# Patient Record
Sex: Female | Born: 1970 | Race: Black or African American | Hispanic: No | Marital: Single | Smoking: Current every day smoker
Health system: Southern US, Community
[De-identification: ages and names within clinical notes are randomized; demographics above are authoritative.]

## PROBLEM LIST (undated history)

## (undated) DIAGNOSIS — D649 Anemia, unspecified: Secondary | ICD-10-CM

## (undated) DIAGNOSIS — K219 Gastro-esophageal reflux disease without esophagitis: Secondary | ICD-10-CM

## (undated) DIAGNOSIS — C801 Malignant (primary) neoplasm, unspecified: Secondary | ICD-10-CM

## (undated) DIAGNOSIS — R7303 Prediabetes: Secondary | ICD-10-CM

## (undated) DIAGNOSIS — I10 Essential (primary) hypertension: Secondary | ICD-10-CM

## (undated) DIAGNOSIS — H409 Unspecified glaucoma: Secondary | ICD-10-CM

## (undated) HISTORY — PX: COLONOSCOPY W/ POLYPECTOMY: SHX1380

## (undated) HISTORY — PX: VULVA SURGERY: SHX837

---

## 2004-11-15 ENCOUNTER — Emergency Department (HOSPITAL_COMMUNITY): Admission: EM | Admit: 2004-11-15 | Discharge: 2004-11-15 | Payer: Self-pay | Admitting: Emergency Medicine

## 2006-06-06 ENCOUNTER — Emergency Department (HOSPITAL_COMMUNITY): Admission: EM | Admit: 2006-06-06 | Discharge: 2006-06-07 | Payer: Self-pay | Admitting: Emergency Medicine

## 2006-06-14 ENCOUNTER — Encounter (HOSPITAL_COMMUNITY): Admission: RE | Admit: 2006-06-14 | Discharge: 2006-07-11 | Payer: Self-pay | Admitting: Family Medicine

## 2014-09-21 ENCOUNTER — Emergency Department (HOSPITAL_COMMUNITY)
Admission: EM | Admit: 2014-09-21 | Discharge: 2014-09-21 | Disposition: A | Discharge status: Home / Self Care | Payer: BLUE CROSS/BLUE SHIELD | Attending: Emergency Medicine | Admitting: Emergency Medicine

## 2014-09-21 ENCOUNTER — Encounter (HOSPITAL_COMMUNITY): Payer: Self-pay | Admitting: Emergency Medicine

## 2014-09-21 ENCOUNTER — Emergency Department (HOSPITAL_COMMUNITY): Payer: BLUE CROSS/BLUE SHIELD

## 2014-09-21 DIAGNOSIS — Y9389 Activity, other specified: Secondary | ICD-10-CM | POA: Diagnosis not present

## 2014-09-21 DIAGNOSIS — Y9289 Other specified places as the place of occurrence of the external cause: Secondary | ICD-10-CM | POA: Insufficient documentation

## 2014-09-21 DIAGNOSIS — Z8669 Personal history of other diseases of the nervous system and sense organs: Secondary | ICD-10-CM | POA: Insufficient documentation

## 2014-09-21 DIAGNOSIS — Z720 Tobacco use: Secondary | ICD-10-CM | POA: Insufficient documentation

## 2014-09-21 DIAGNOSIS — S62610A Displaced fracture of proximal phalanx of right index finger, initial encounter for closed fracture: Secondary | ICD-10-CM | POA: Diagnosis not present

## 2014-09-21 DIAGNOSIS — X58XXXA Exposure to other specified factors, initial encounter: Secondary | ICD-10-CM | POA: Diagnosis not present

## 2014-09-21 DIAGNOSIS — I10 Essential (primary) hypertension: Secondary | ICD-10-CM | POA: Diagnosis not present

## 2014-09-21 DIAGNOSIS — S62619A Displaced fracture of proximal phalanx of unspecified finger, initial encounter for closed fracture: Secondary | ICD-10-CM

## 2014-09-21 DIAGNOSIS — S6991XA Unspecified injury of right wrist, hand and finger(s), initial encounter: Secondary | ICD-10-CM | POA: Diagnosis present

## 2014-09-21 DIAGNOSIS — Y998 Other external cause status: Secondary | ICD-10-CM | POA: Diagnosis not present

## 2014-09-21 HISTORY — DX: Essential (primary) hypertension: I10

## 2014-09-21 HISTORY — DX: Unspecified glaucoma: H40.9

## 2014-09-21 MED ORDER — KETOROLAC TROMETHAMINE 60 MG/2ML IM SOLN
60.0000 mg | Freq: Once | INTRAMUSCULAR | Status: AC
  Administered 2014-09-21: 60 mg via INTRAMUSCULAR
  Filled 2014-09-21: qty 2

## 2014-09-21 MED ORDER — NAPROXEN 500 MG PO TABS: 500.0000 mg | ORAL_TABLET | Freq: Two times a day (BID) | ORAL | Status: DC

## 2014-09-21 NOTE — Discharge Instructions (Signed)
Cast or Splint Care Casts and splints support injured limbs and keep bones from moving while they heal.  HOME CARE  Keep the cast or splint uncovered during the drying period.  A plaster cast can take 24 to 48 hours to dry.  A fiberglass cast will dry in less than 1 hour.  Do not rest the cast on anything harder than a pillow for 24 hours.  Do not put weight on your injured limb. Do not put pressure on the cast. Wait for your doctor's approval.  Keep the cast or splint dry.  Cover the cast or splint with a plastic bag during baths or wet weather.  If you have a cast over your chest and belly (trunk), take sponge baths until the cast is taken off.  If your cast gets wet, dry it with a towel or blow dryer. Use the cool setting on the blow dryer.  Keep your cast or splint clean. Wash a dirty cast with a damp cloth.  Do not put any objects under your cast or splint.  Do not scratch the skin under the cast with an object. If itching is a problem, use a blow dryer on a cool setting over the itchy area.  Do not trim or cut your cast.  Do not take out the padding from inside your cast.  Exercise your joints near the cast as told by your doctor.  Raise (elevate) your injured limb on 1 or 2 pillows for the first 1 to 3 days. GET HELP IF:  Your cast or splint cracks.  Your cast or splint is too tight or too loose.  You itch badly under the cast.  Your cast gets wet or has a soft spot.  You have a bad smell coming from the cast.  You get an object stuck under the cast.  Your skin around the cast becomes red or sore.  You have new or more pain after the cast is put on. GET HELP RIGHT AWAY IF:  You have fluid leaking through the cast.  You cannot move your fingers or toes.  Your fingers or toes turn blue or white or are cool, painful, or puffy (swollen).  You have tingling or lose feeling (numbness) around the injured area.  You have bad pain or pressure under the  cast.  You have trouble breathing or have shortness of breath.  You have chest pain. Document Released: 10/28/2010 Document Revised: 02/28/2013 Document Reviewed: 01/04/2013 [facility] Patient Information 2015 [place], [place]. This information is not intended to replace advice given to you by your health care provider. Make sure you discuss any questions you have with your health care provider.

## 2014-09-21 NOTE — ED Notes (Signed)
Patient c/o right index finger swelling and pain. Per patient started swelling yesterday with pain. Per patient worse today, patient unable to bend finger. Denies any known injury. Per patient did the same thing a month ago in which she applied ice several times and soreness stayed for "several days" but eventually went away.

## 2014-09-23 NOTE — ED Provider Notes (Signed)
CSN: [id number]     Arrival date & time 09/21/14  0756 History   First MD Initiated Contact with Patient 09/21/14 0825     Chief Complaint  Patient presents with  . Hand Pain     (Consider location/radiation/quality/duration/timing/severity/associated sxs/prior Treatment) HPI  Anna Case is a 44 y.o. female who presents to the Emergency Department complaining of right index finger pain and swelling for 1-2 days.  She states the pain is a throbbing sensation and worse with attempted movement of the finger.  She states that she performs repetitive movements and uses shears at her job.  She denies known injury, numbness, redness of the finger or pain proximal to the finger.  She has not taken any medications for her symptoms.  Past Medical History  Diagnosis Date  . Hypertension   . Glaucoma    History reviewed. No pertinent past surgical history. Family History  Problem Relation Age of Onset  . Diabetes Mother   . Heart attack Father    History  Substance Use Topics  . Smoking status: Current Every Day Smoker -- 0.07 packs/day for 20 years    Types: Cigarettes  . Smokeless tobacco: Never Used  . Alcohol Use: No   OB History    Gravida Para Term Preterm AB TAB SAB Ectopic Multiple Living            0     Review of Systems  Constitutional: Negative for fever and chills.  Genitourinary: Negative for dysuria and difficulty urinating.  Musculoskeletal: Positive for joint swelling and arthralgias.  Skin: Negative for color change and wound.  All other systems reviewed and are negative.     Allergies  Review of patient's allergies indicates no known allergies.  Home Medications   Prior to Admission medications   Medication Sig Start Date End Date Taking? Authorizing Provider  naproxen (NAPROSYN) 500 MG tablet Take 1 tablet (500 mg total) by mouth 2 (two) times daily with a meal. 09/21/14   Tammi Shandell Giovanni, PA-C   BP 187/106 mmHg  Pulse 87  Temp(Src) 98.2 F  (36.8 C) (Oral)  Resp 18  SpO2 100%  LMP 09/07/2014 Physical Exam  Constitutional: She is oriented to person, place, and time. She appears well-developed and well-nourished. No distress.  HENT:  Head: Normocephalic and atraumatic.  Cardiovascular: Normal rate, regular rhythm, normal heart sounds and intact distal pulses.   Pulmonary/Chest: Effort normal and breath sounds normal. No respiratory distress.  Musculoskeletal: She exhibits edema and tenderness.  Localized tenderness and STS of the PIP joint of the right index finger.  Radial pulse is brisk, distal sensation intact.  CR< 2 sec.  No bruising. erythema or bony deformity.  Patient has full ROM.  Neurological: She is alert and oriented to person, place, and time. She exhibits normal muscle tone. Coordination normal.  Skin: Skin is warm and dry. No rash noted. No erythema.  Nursing note and vitals reviewed.   ED Course  Procedures (including critical care time) Labs Review Labs Reviewed - No data to display  Imaging Review Dg Finger Index Right  09/21/2014   CLINICAL DATA:  Proximal PIP joint pain and swelling. Inability to straighten finger completely.  EXAM: RIGHT INDEX FINGER 2+V  COMPARISON:  None.  FINDINGS: There is a tiny (approximately 2 mm) osseous fragment about the ulnar distal aspect of the proximal phalanx of the second digit, likely the sequela of age-indeterminate avulsive injury. This finding is associated with apparent adjacent soft tissue swelling. No  dislocation. Joint spaces appear grossly preserved given obliquity. No radiopaque foreign body.  IMPRESSION: Age-indeterminate tiny (approximately 2 mm) osseous fragment about the distal ulnar aspect of the proximal phalanx of the second digit with associated adjacent soft tissue swelling. Correlation for point tenderness at this location is recommended.   Electronically Signed   By: Sandi Mariscal M.D.   On: 09/21/2014 09:10      EKG Interpretation None      MDM    Final diagnoses:  Avulsion fracture of proximal phalanx of finger, closed, initial encounter    Pt with tenderness and edema of the right index finger.  NV intact.  Possible avulsion fx.  Finger splinted, pain improved.  She agrees to close orthopedic f/u.  rx for naprosyn    Patrice Paradise, PA-C 09/23/14 2053  Nat Christen, MD 09/26/14 1534

## 2017-05-03 ENCOUNTER — Encounter: Payer: Self-pay | Admitting: *Deleted

## 2017-05-06 ENCOUNTER — Encounter: Payer: Self-pay | Admitting: Obstetrics & Gynecology

## 2020-08-04 DIAGNOSIS — H2513 Age-related nuclear cataract, bilateral: Secondary | ICD-10-CM | POA: Diagnosis not present

## 2020-08-04 DIAGNOSIS — H401133 Primary open-angle glaucoma, bilateral, severe stage: Secondary | ICD-10-CM | POA: Diagnosis not present

## 2020-08-13 DIAGNOSIS — Z0100 Encounter for examination of eyes and vision without abnormal findings: Secondary | ICD-10-CM | POA: Diagnosis not present

## 2020-08-25 DIAGNOSIS — H401133 Primary open-angle glaucoma, bilateral, severe stage: Secondary | ICD-10-CM | POA: Diagnosis not present

## 2020-08-25 DIAGNOSIS — H2513 Age-related nuclear cataract, bilateral: Secondary | ICD-10-CM | POA: Diagnosis not present

## 2020-11-12 DIAGNOSIS — H2513 Age-related nuclear cataract, bilateral: Secondary | ICD-10-CM | POA: Diagnosis not present

## 2020-11-12 DIAGNOSIS — H401133 Primary open-angle glaucoma, bilateral, severe stage: Secondary | ICD-10-CM | POA: Diagnosis not present

## 2020-12-10 DIAGNOSIS — Z299 Encounter for prophylactic measures, unspecified: Secondary | ICD-10-CM | POA: Diagnosis not present

## 2020-12-10 DIAGNOSIS — I10 Essential (primary) hypertension: Secondary | ICD-10-CM | POA: Diagnosis not present

## 2020-12-10 DIAGNOSIS — H409 Unspecified glaucoma: Secondary | ICD-10-CM | POA: Diagnosis not present

## 2020-12-10 DIAGNOSIS — F17210 Nicotine dependence, cigarettes, uncomplicated: Secondary | ICD-10-CM | POA: Diagnosis not present

## 2020-12-10 DIAGNOSIS — Z6836 Body mass index (BMI) 36.0-36.9, adult: Secondary | ICD-10-CM | POA: Diagnosis not present

## 2020-12-30 DIAGNOSIS — Z79899 Other long term (current) drug therapy: Secondary | ICD-10-CM | POA: Diagnosis not present

## 2020-12-30 DIAGNOSIS — I10 Essential (primary) hypertension: Secondary | ICD-10-CM | POA: Diagnosis not present

## 2020-12-30 DIAGNOSIS — Z1339 Encounter for screening examination for other mental health and behavioral disorders: Secondary | ICD-10-CM | POA: Diagnosis not present

## 2020-12-30 DIAGNOSIS — Z1231 Encounter for screening mammogram for malignant neoplasm of breast: Secondary | ICD-10-CM | POA: Diagnosis not present

## 2020-12-30 DIAGNOSIS — Z6837 Body mass index (BMI) 37.0-37.9, adult: Secondary | ICD-10-CM | POA: Diagnosis not present

## 2020-12-30 DIAGNOSIS — Z1211 Encounter for screening for malignant neoplasm of colon: Secondary | ICD-10-CM | POA: Diagnosis not present

## 2020-12-30 DIAGNOSIS — R87610 Atypical squamous cells of undetermined significance on cytologic smear of cervix (ASC-US): Secondary | ICD-10-CM | POA: Diagnosis not present

## 2020-12-30 DIAGNOSIS — Z0000 Encounter for general adult medical examination without abnormal findings: Secondary | ICD-10-CM | POA: Diagnosis not present

## 2020-12-30 DIAGNOSIS — R875 Abnormal microbiological findings in specimens from female genital organs: Secondary | ICD-10-CM | POA: Diagnosis not present

## 2020-12-30 DIAGNOSIS — Z01419 Encounter for gynecological examination (general) (routine) without abnormal findings: Secondary | ICD-10-CM | POA: Diagnosis not present

## 2020-12-30 DIAGNOSIS — Z7189 Other specified counseling: Secondary | ICD-10-CM | POA: Diagnosis not present

## 2021-01-08 ENCOUNTER — Other Ambulatory Visit: Payer: Self-pay | Admitting: Internal Medicine

## 2021-01-08 DIAGNOSIS — Z139 Encounter for screening, unspecified: Secondary | ICD-10-CM

## 2021-01-09 ENCOUNTER — Ambulatory Visit
Admission: RE | Admit: 2021-01-09 | Discharge: 2021-01-09 | Disposition: A | Discharge status: Home / Self Care | Payer: Medicare HMO | Source: Ambulatory Visit | Attending: Internal Medicine | Admitting: Internal Medicine

## 2021-01-09 ENCOUNTER — Other Ambulatory Visit: Payer: Self-pay

## 2021-01-09 DIAGNOSIS — Z1231 Encounter for screening mammogram for malignant neoplasm of breast: Secondary | ICD-10-CM | POA: Diagnosis not present

## 2021-01-09 DIAGNOSIS — Z139 Encounter for screening, unspecified: Secondary | ICD-10-CM

## 2021-01-23 DIAGNOSIS — E2839 Other primary ovarian failure: Secondary | ICD-10-CM | POA: Diagnosis not present

## 2021-02-02 DIAGNOSIS — F17210 Nicotine dependence, cigarettes, uncomplicated: Secondary | ICD-10-CM | POA: Diagnosis not present

## 2021-02-02 DIAGNOSIS — Z299 Encounter for prophylactic measures, unspecified: Secondary | ICD-10-CM | POA: Diagnosis not present

## 2021-02-02 DIAGNOSIS — I10 Essential (primary) hypertension: Secondary | ICD-10-CM | POA: Diagnosis not present

## 2021-03-13 DIAGNOSIS — H04123 Dry eye syndrome of bilateral lacrimal glands: Secondary | ICD-10-CM | POA: Diagnosis not present

## 2021-03-13 DIAGNOSIS — H2513 Age-related nuclear cataract, bilateral: Secondary | ICD-10-CM | POA: Diagnosis not present

## 2021-03-13 DIAGNOSIS — H401133 Primary open-angle glaucoma, bilateral, severe stage: Secondary | ICD-10-CM | POA: Diagnosis not present

## 2021-04-22 DIAGNOSIS — L0201 Cutaneous abscess of face: Secondary | ICD-10-CM | POA: Diagnosis not present

## 2021-05-13 DIAGNOSIS — Z299 Encounter for prophylactic measures, unspecified: Secondary | ICD-10-CM | POA: Diagnosis not present

## 2021-05-13 DIAGNOSIS — I10 Essential (primary) hypertension: Secondary | ICD-10-CM | POA: Diagnosis not present

## 2021-05-13 DIAGNOSIS — E2839 Other primary ovarian failure: Secondary | ICD-10-CM | POA: Diagnosis not present

## 2021-05-13 DIAGNOSIS — F17210 Nicotine dependence, cigarettes, uncomplicated: Secondary | ICD-10-CM | POA: Diagnosis not present

## 2021-05-13 DIAGNOSIS — L0291 Cutaneous abscess, unspecified: Secondary | ICD-10-CM | POA: Diagnosis not present

## 2021-06-01 DIAGNOSIS — Z1211 Encounter for screening for malignant neoplasm of colon: Secondary | ICD-10-CM | POA: Diagnosis not present

## 2021-06-02 DIAGNOSIS — D126 Benign neoplasm of colon, unspecified: Secondary | ICD-10-CM | POA: Insufficient documentation

## 2021-07-17 DIAGNOSIS — H2513 Age-related nuclear cataract, bilateral: Secondary | ICD-10-CM | POA: Diagnosis not present

## 2021-07-17 DIAGNOSIS — H04123 Dry eye syndrome of bilateral lacrimal glands: Secondary | ICD-10-CM | POA: Diagnosis not present

## 2021-07-17 DIAGNOSIS — H401133 Primary open-angle glaucoma, bilateral, severe stage: Secondary | ICD-10-CM | POA: Diagnosis not present

## 2021-07-23 DIAGNOSIS — D122 Benign neoplasm of ascending colon: Secondary | ICD-10-CM | POA: Diagnosis not present

## 2021-07-23 DIAGNOSIS — I10 Essential (primary) hypertension: Secondary | ICD-10-CM | POA: Diagnosis not present

## 2021-07-23 DIAGNOSIS — D126 Benign neoplasm of colon, unspecified: Secondary | ICD-10-CM | POA: Diagnosis not present

## 2021-07-23 DIAGNOSIS — F17210 Nicotine dependence, cigarettes, uncomplicated: Secondary | ICD-10-CM | POA: Diagnosis not present

## 2021-07-23 DIAGNOSIS — Z79899 Other long term (current) drug therapy: Secondary | ICD-10-CM | POA: Diagnosis not present

## 2021-07-23 DIAGNOSIS — K635 Polyp of colon: Secondary | ICD-10-CM | POA: Diagnosis not present

## 2021-07-23 DIAGNOSIS — Z1211 Encounter for screening for malignant neoplasm of colon: Secondary | ICD-10-CM | POA: Diagnosis not present

## 2021-08-17 DIAGNOSIS — Z299 Encounter for prophylactic measures, unspecified: Secondary | ICD-10-CM | POA: Diagnosis not present

## 2021-08-17 DIAGNOSIS — D126 Benign neoplasm of colon, unspecified: Secondary | ICD-10-CM | POA: Diagnosis not present

## 2021-08-17 DIAGNOSIS — F17210 Nicotine dependence, cigarettes, uncomplicated: Secondary | ICD-10-CM | POA: Diagnosis not present

## 2021-08-17 DIAGNOSIS — I10 Essential (primary) hypertension: Secondary | ICD-10-CM | POA: Diagnosis not present

## 2021-08-17 DIAGNOSIS — Z713 Dietary counseling and surveillance: Secondary | ICD-10-CM | POA: Diagnosis not present

## 2021-08-17 DIAGNOSIS — Z6836 Body mass index (BMI) 36.0-36.9, adult: Secondary | ICD-10-CM | POA: Diagnosis not present

## 2021-08-28 DIAGNOSIS — R6889 Other general symptoms and signs: Secondary | ICD-10-CM | POA: Diagnosis not present

## 2021-11-16 DIAGNOSIS — H2513 Age-related nuclear cataract, bilateral: Secondary | ICD-10-CM | POA: Diagnosis not present

## 2021-11-16 DIAGNOSIS — H04123 Dry eye syndrome of bilateral lacrimal glands: Secondary | ICD-10-CM | POA: Diagnosis not present

## 2021-11-16 DIAGNOSIS — H401133 Primary open-angle glaucoma, bilateral, severe stage: Secondary | ICD-10-CM | POA: Diagnosis not present

## 2021-11-17 DIAGNOSIS — F17210 Nicotine dependence, cigarettes, uncomplicated: Secondary | ICD-10-CM | POA: Diagnosis not present

## 2021-11-17 DIAGNOSIS — I10 Essential (primary) hypertension: Secondary | ICD-10-CM | POA: Diagnosis not present

## 2021-11-17 DIAGNOSIS — Z299 Encounter for prophylactic measures, unspecified: Secondary | ICD-10-CM | POA: Diagnosis not present

## 2021-12-03 ENCOUNTER — Encounter: Payer: Self-pay | Admitting: Podiatry

## 2021-12-03 ENCOUNTER — Ambulatory Visit (INDEPENDENT_AMBULATORY_CARE_PROVIDER_SITE_OTHER): Payer: No Typology Code available for payment source | Admitting: Podiatry

## 2021-12-03 DIAGNOSIS — H548 Legal blindness, as defined in USA: Secondary | ICD-10-CM | POA: Diagnosis not present

## 2021-12-03 DIAGNOSIS — L309 Dermatitis, unspecified: Secondary | ICD-10-CM | POA: Diagnosis not present

## 2021-12-03 DIAGNOSIS — B351 Tinea unguium: Secondary | ICD-10-CM | POA: Diagnosis not present

## 2021-12-03 DIAGNOSIS — M79675 Pain in left toe(s): Secondary | ICD-10-CM

## 2021-12-03 DIAGNOSIS — M79674 Pain in right toe(s): Secondary | ICD-10-CM

## 2021-12-03 MED ORDER — BETAMETHASONE DIPROPIONATE 0.05 % EX CREA: TOPICAL_CREAM | Freq: Two times a day (BID) | CUTANEOUS | 1 refills | Status: DC

## 2021-12-03 NOTE — Progress Notes (Signed)
  Subjective:  Patient ID: Anna Case, female    DOB: 09/04/1970,  MRN: [id number]  Chief Complaint  Patient presents with   Foot Pain    I have a rash on the side of the right foot and it itches and has been going on for about 6 months and is a dark color    Nail Problem    Trim nails     51 y.o. female presents with the above complaint. History confirmed with patient.  Nails are thickened elongated and she is unable to cut them.  She is legally blind.  She also has a itchy dry rash on the medial ankle on the right foot.  There is dark and discolored has been there for about 6 months.  Objective:  Physical Exam: warm, good capillary refill, no trophic changes or ulcerative lesions, normal DP and PT pulses, and normal sensory exam. Left Foot: dystrophic yellowed discolored nail plates with subungual debris Right Foot: dystrophic yellowed discolored nail plates with subungual debris and dry itching scaling hyper pigmented dermatitis rash on the medial ankle   Assessment:   1. Dermatitis of right foot      Plan:  Patient was evaluated and treated and all questions answered.  Discussed the etiology and treatment options for the condition in detail with the patient. Educated patient on the topical and oral treatment options for mycotic nails. Recommended debridement of the nails today. Sharp and mechanical debridement performed of all painful and mycotic nails today. Nails debrided in length and thickness using a nail nipper to level of comfort. Discussed treatment options including appropriate shoe gear. Follow up as needed for painful nails.    Discussed the rash on the right ankle.  Appears to be some sort of dermatitis.  No history of psoriasis or eczema.  No evidence of significant venous insufficiency currently.  Recommended a topical corticosteroid.  Diprolene cream was sent to pharmacy.  Use twice daily.  Follow-up with APRN for this, will return in 3 months for regular  routine nail debridement.  Return in about 3 months (around 03/05/2022) for nail trim .

## 2022-01-14 DIAGNOSIS — H401133 Primary open-angle glaucoma, bilateral, severe stage: Secondary | ICD-10-CM | POA: Diagnosis not present

## 2022-01-29 IMAGING — MG MM DIGITAL SCREENING BILAT W/ TOMO AND CAD
6 of 10 series · 6 of 30 positions shown · non-contrast
Comparison: Previous exam(s).

CLINICAL DATA: Screening.

EXAM:
DIGITAL SCREENING BILATERAL MAMMOGRAM WITH TOMOSYNTHESIS AND CAD
TECHNIQUE: Bilateral screening digital craniocaudal and mediolateral oblique
mammograms were obtained. Bilateral screening digital breast
tomosynthesis was performed. The images were evaluated with
computer-aided detection.

[R MLO synth-2D (1 of 2)]
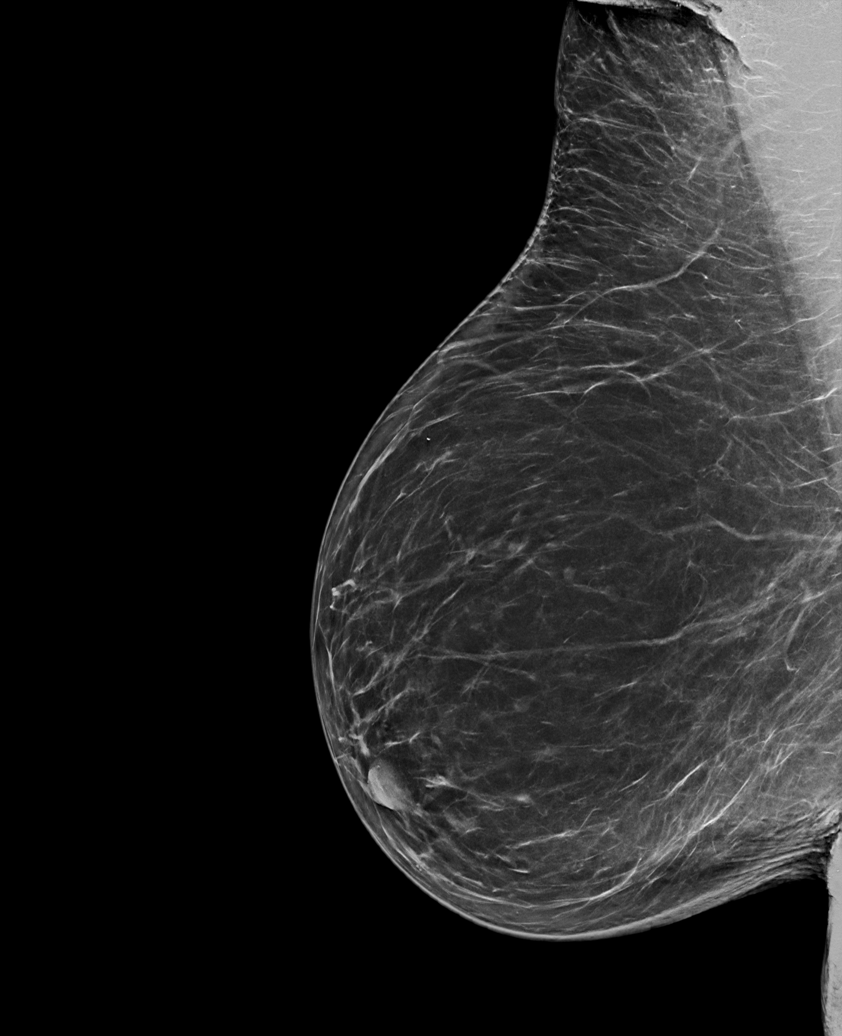

[L MLO synth-2D]
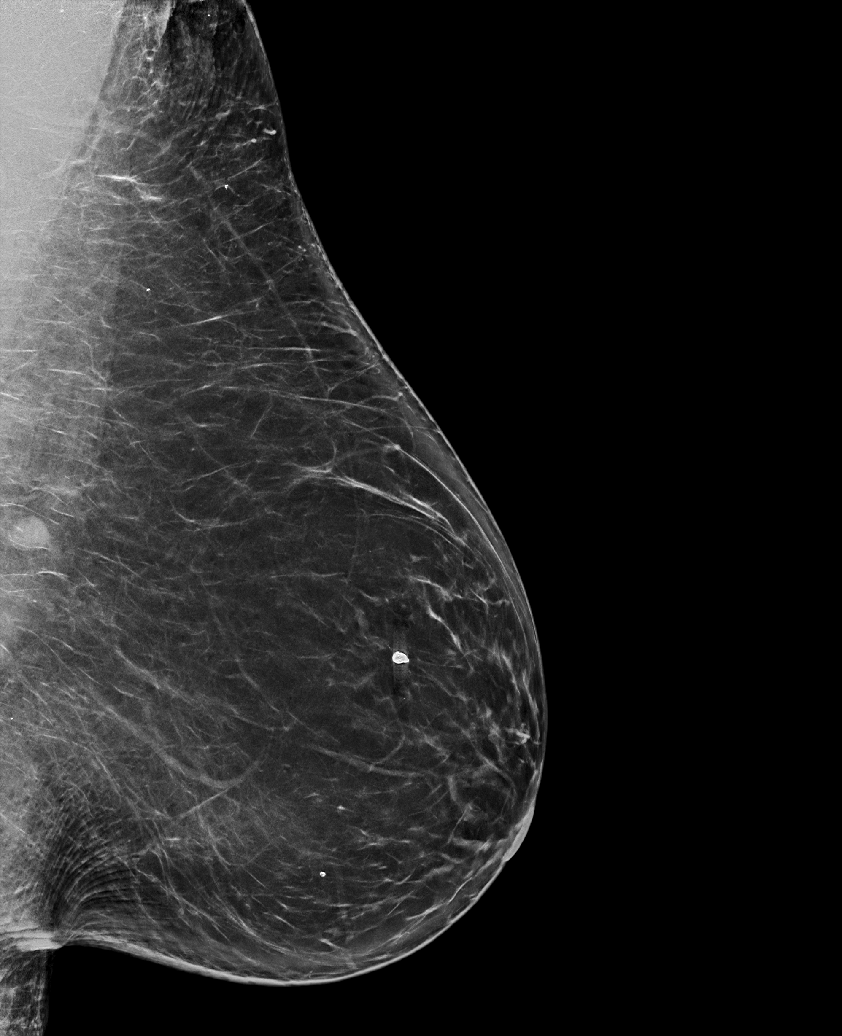

[R MLO synth-2D (2 of 2)]
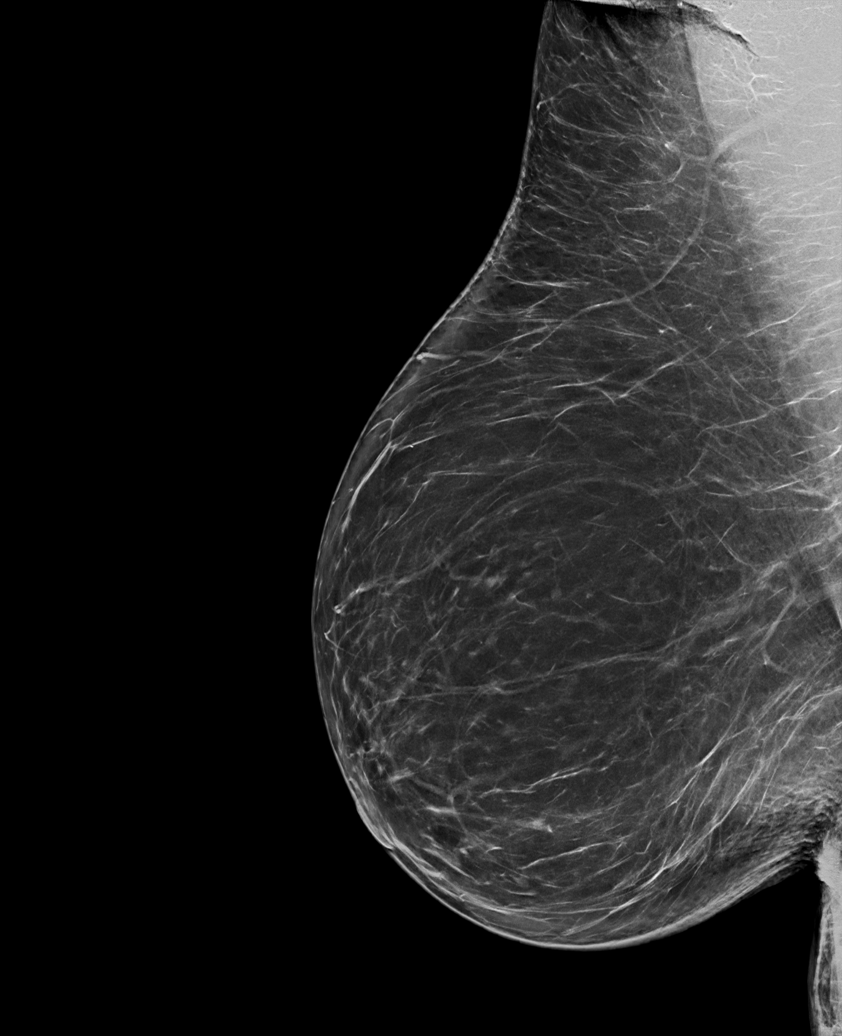

[R CC synth-2D]
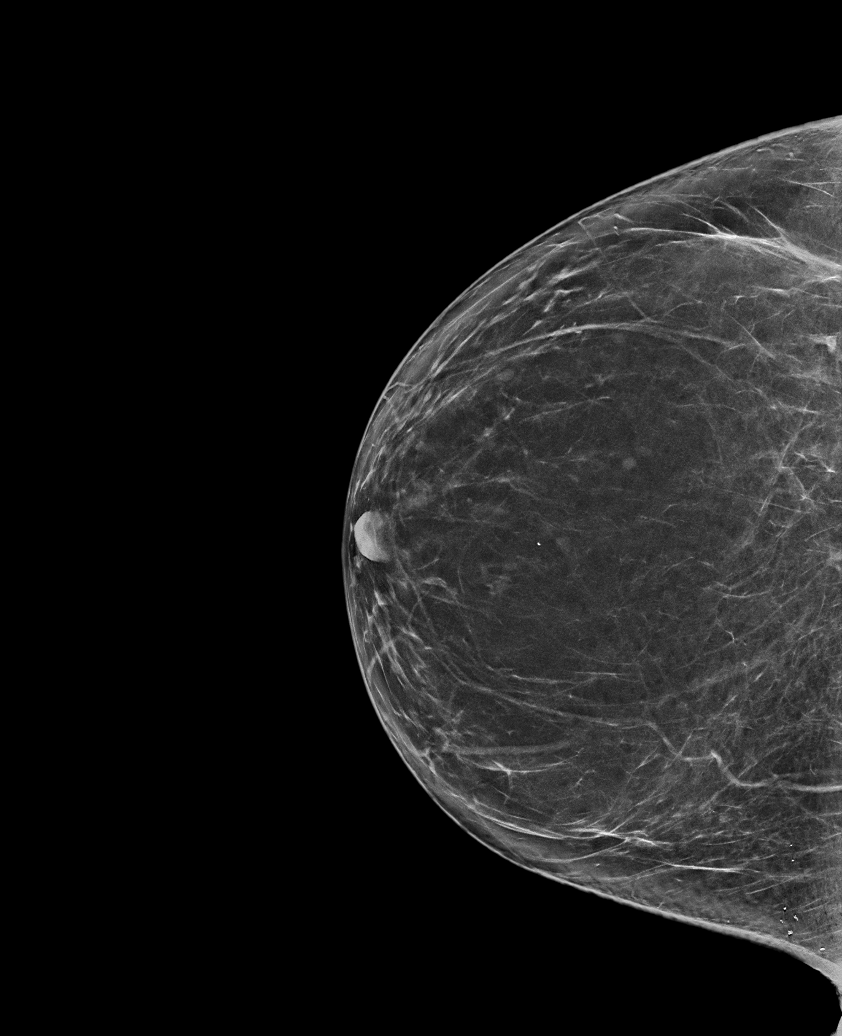

[L CC synth-2D]
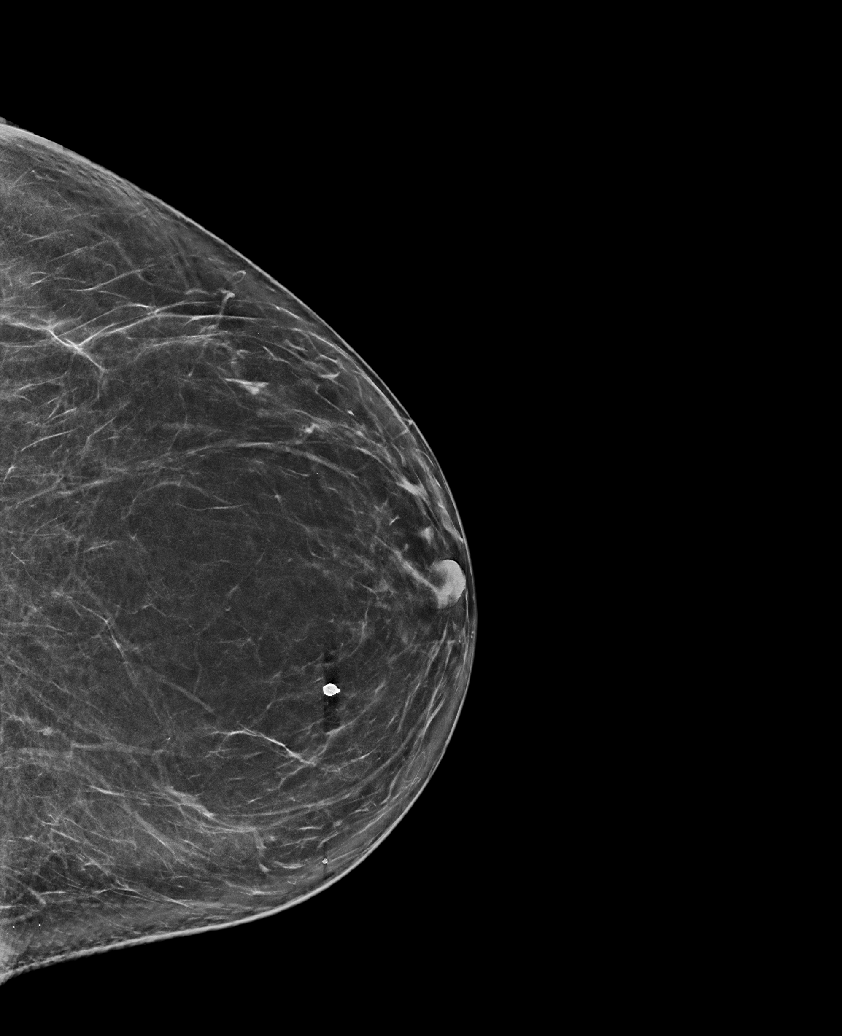

[L CC tomo · tomo slice 39/76.0]
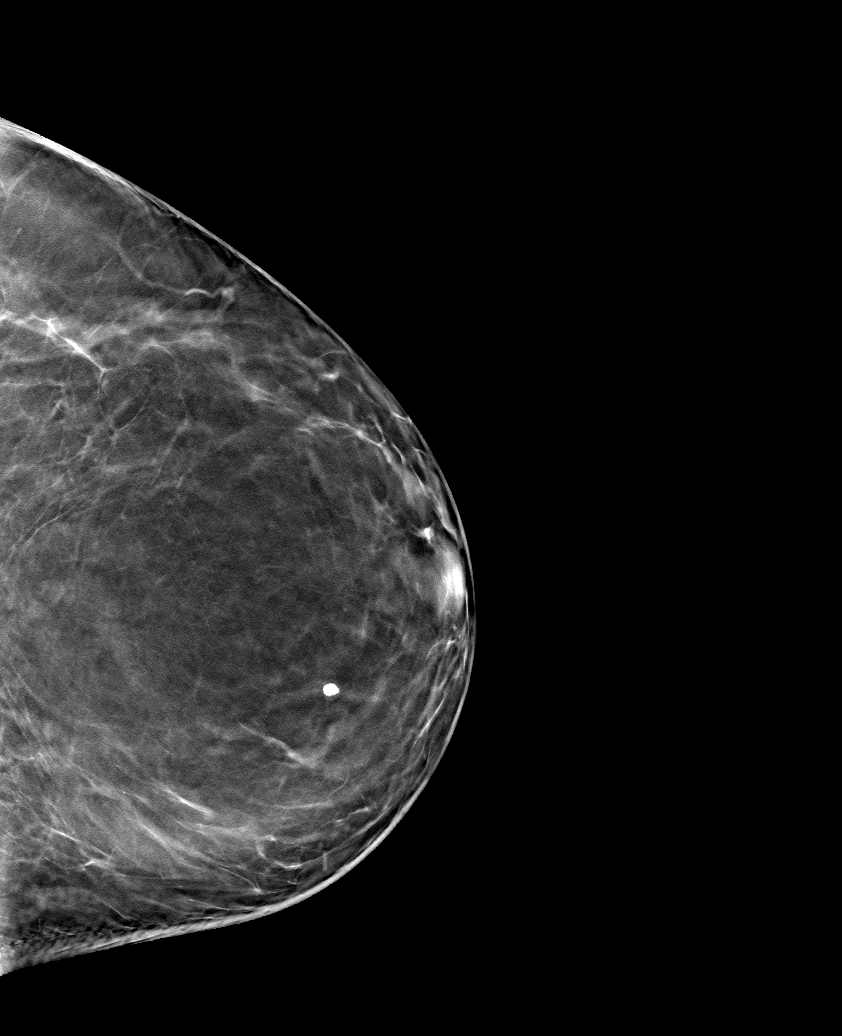

[6 of 30 positions shown; findings below may reference images not displayed]

ACR Breast Density Category b: There are scattered areas of
fibroglandular density.
FINDINGS: There are no findings suspicious for malignancy.
IMPRESSION: No mammographic evidence of malignancy. A result letter of this
screening mammogram will be mailed directly to the patient.

RECOMMENDATION:
Screening mammogram in one year. (Code:51-O-LD2)

BI-RADS CATEGORY  1: Negative.

## 2022-02-10 DIAGNOSIS — R69 Illness, unspecified: Secondary | ICD-10-CM | POA: Diagnosis not present

## 2022-02-10 DIAGNOSIS — H04123 Dry eye syndrome of bilateral lacrimal glands: Secondary | ICD-10-CM | POA: Diagnosis not present

## 2022-02-10 DIAGNOSIS — H2513 Age-related nuclear cataract, bilateral: Secondary | ICD-10-CM | POA: Diagnosis not present

## 2022-02-10 DIAGNOSIS — H401133 Primary open-angle glaucoma, bilateral, severe stage: Secondary | ICD-10-CM | POA: Diagnosis not present

## 2022-02-15 ENCOUNTER — Other Ambulatory Visit: Payer: Self-pay | Admitting: Student

## 2022-02-15 DIAGNOSIS — Z1231 Encounter for screening mammogram for malignant neoplasm of breast: Secondary | ICD-10-CM

## 2022-02-22 DIAGNOSIS — I10 Essential (primary) hypertension: Secondary | ICD-10-CM | POA: Diagnosis not present

## 2022-02-22 DIAGNOSIS — Z299 Encounter for prophylactic measures, unspecified: Secondary | ICD-10-CM | POA: Diagnosis not present

## 2022-02-22 DIAGNOSIS — N939 Abnormal uterine and vaginal bleeding, unspecified: Secondary | ICD-10-CM | POA: Diagnosis not present

## 2022-02-22 DIAGNOSIS — F17210 Nicotine dependence, cigarettes, uncomplicated: Secondary | ICD-10-CM | POA: Diagnosis not present

## 2022-03-09 ENCOUNTER — Ambulatory Visit: Payer: No Typology Code available for payment source | Admitting: Podiatry

## 2022-03-24 ENCOUNTER — Ambulatory Visit
Admission: RE | Admit: 2022-03-24 | Discharge: 2022-03-24 | Disposition: A | Discharge status: Home / Self Care | Payer: No Typology Code available for payment source | Source: Ambulatory Visit | Attending: Student | Admitting: Student

## 2022-03-24 DIAGNOSIS — Z1231 Encounter for screening mammogram for malignant neoplasm of breast: Secondary | ICD-10-CM

## 2022-03-27 ENCOUNTER — Encounter: Payer: Self-pay | Admitting: Podiatry

## 2022-03-27 ENCOUNTER — Ambulatory Visit (INDEPENDENT_AMBULATORY_CARE_PROVIDER_SITE_OTHER): Payer: No Typology Code available for payment source | Admitting: Podiatry

## 2022-03-27 DIAGNOSIS — M79675 Pain in left toe(s): Secondary | ICD-10-CM

## 2022-03-27 DIAGNOSIS — B351 Tinea unguium: Secondary | ICD-10-CM

## 2022-03-27 DIAGNOSIS — L309 Dermatitis, unspecified: Secondary | ICD-10-CM | POA: Diagnosis not present

## 2022-03-27 DIAGNOSIS — M79674 Pain in right toe(s): Secondary | ICD-10-CM

## 2022-03-27 DIAGNOSIS — R69 Illness, unspecified: Secondary | ICD-10-CM | POA: Diagnosis not present

## 2022-03-27 MED ORDER — BETAMETHASONE DIPROPIONATE 0.05 % EX CREA: TOPICAL_CREAM | Freq: Two times a day (BID) | CUTANEOUS | 2 refills | Status: DC

## 2022-03-28 DIAGNOSIS — R69 Illness, unspecified: Secondary | ICD-10-CM | POA: Diagnosis not present

## 2022-04-03 NOTE — Progress Notes (Signed)
  Subjective:  Patient ID: Anna Case, female    DOB: [birth year],  MRN: [id number]  Anna Case presents to clinic today for painful elongated mycotic toenails 1-5 bilaterally which are tender when wearing enclosed shoe gear. Pain is relieved with periodic professional debridement.   Patient has also seen Dr. Sherryle Lis for skin eruption of right ankle. States she needs refill on prescribed cream.  New problem(s): None.   PCP is Pcp, No.  No Known Allergies  Review of Systems: Negative except as noted in the HPI. Objective:   Constitutional Anna Case is a pleasant 51 y.o.  female, in NAD. AAO x 3.   Vascular CFT immediate b/l LE. Palpable DP/PT pulses b/l LE. Digital hair present b/l. Skin temperature gradient WNL b/l. No pain with calf compression b/l. No edema noted b/l. No cyanosis or clubbing noted b/l LE.  Neurologic Normal speech. Oriented to person, place, and time. Protective sensation intact 5/5 intact bilaterally with 10g monofilament b/l. Vibratory sensation intact b/l.  Dermatologic Pedal skin is warm and supple b/l LE. No open wounds b/l LE. No interdigital macerations noted b/l LE. Toenails 1-5 b/l elongated, discolored, dystrophic, thickened, crumbly with subungual debris and tenderness to dorsal palpation. Skin eruption noted medial aspect of right ankle. No erythema, no edema, no weeping, no fluctuance.  Orthopedic: Normal muscle strength 5/5 to all lower extremity muscle groups bilaterally. No pain, crepitus or joint limitation noted with ROM b/l LE. No gross bony pedal deformities b/l. Patient ambulates independently without assistive aids.   Radiographs: None  Assessment:   1. Pain due to onychomycosis of toenails of both feet   2. Dermatitis of right foot    Plan:  Patient was evaluated and treated and all questions answered. Consent given for treatment as described below: -Examined patient. -Patient to continue soft, supportive shoe gear  daily. -Toenails 1-5 b/l were debrided in length and girth with sterile nail nippers and dremel without iatrogenic bleeding.  -Refilled Diprolene Cream for dermatitis right ankle. -Patient/POA to call should there be question/concern in the interim.  Return in about 3 months (around 06/26/2022).  Marzetta Board, DPM

## 2022-05-18 DIAGNOSIS — R69 Illness, unspecified: Secondary | ICD-10-CM | POA: Diagnosis not present

## 2022-05-19 DIAGNOSIS — R69 Illness, unspecified: Secondary | ICD-10-CM | POA: Diagnosis not present

## 2022-06-18 DIAGNOSIS — H04123 Dry eye syndrome of bilateral lacrimal glands: Secondary | ICD-10-CM | POA: Diagnosis not present

## 2022-06-18 DIAGNOSIS — H2513 Age-related nuclear cataract, bilateral: Secondary | ICD-10-CM | POA: Diagnosis not present

## 2022-06-18 DIAGNOSIS — R69 Illness, unspecified: Secondary | ICD-10-CM | POA: Diagnosis not present

## 2022-06-18 DIAGNOSIS — H401133 Primary open-angle glaucoma, bilateral, severe stage: Secondary | ICD-10-CM | POA: Diagnosis not present

## 2022-06-19 DIAGNOSIS — R69 Illness, unspecified: Secondary | ICD-10-CM | POA: Diagnosis not present

## 2022-06-29 ENCOUNTER — Ambulatory Visit: Payer: No Typology Code available for payment source | Admitting: Podiatry

## 2022-07-13 DIAGNOSIS — N950 Postmenopausal bleeding: Secondary | ICD-10-CM | POA: Diagnosis not present

## 2022-07-13 DIAGNOSIS — Z299 Encounter for prophylactic measures, unspecified: Secondary | ICD-10-CM | POA: Diagnosis not present

## 2022-07-13 DIAGNOSIS — F17210 Nicotine dependence, cigarettes, uncomplicated: Secondary | ICD-10-CM | POA: Diagnosis not present

## 2022-07-13 DIAGNOSIS — I10 Essential (primary) hypertension: Secondary | ICD-10-CM | POA: Diagnosis not present

## 2022-07-30 DIAGNOSIS — Z79899 Other long term (current) drug therapy: Secondary | ICD-10-CM | POA: Diagnosis not present

## 2022-07-30 DIAGNOSIS — Z0000 Encounter for general adult medical examination without abnormal findings: Secondary | ICD-10-CM | POA: Diagnosis not present

## 2022-08-10 DIAGNOSIS — D500 Iron deficiency anemia secondary to blood loss (chronic): Secondary | ICD-10-CM | POA: Diagnosis not present

## 2022-08-10 DIAGNOSIS — N938 Other specified abnormal uterine and vaginal bleeding: Secondary | ICD-10-CM | POA: Diagnosis not present

## 2022-08-10 DIAGNOSIS — N763 Subacute and chronic vulvitis: Secondary | ICD-10-CM | POA: Diagnosis not present

## 2022-08-10 DIAGNOSIS — R300 Dysuria: Secondary | ICD-10-CM | POA: Diagnosis not present

## 2022-08-13 DIAGNOSIS — L02411 Cutaneous abscess of right axilla: Secondary | ICD-10-CM | POA: Diagnosis not present

## 2022-08-13 DIAGNOSIS — N938 Other specified abnormal uterine and vaginal bleeding: Secondary | ICD-10-CM | POA: Diagnosis not present

## 2022-08-13 DIAGNOSIS — L723 Sebaceous cyst: Secondary | ICD-10-CM | POA: Diagnosis not present

## 2022-08-24 DIAGNOSIS — Z113 Encounter for screening for infections with a predominantly sexual mode of transmission: Secondary | ICD-10-CM | POA: Diagnosis not present

## 2022-08-24 DIAGNOSIS — N898 Other specified noninflammatory disorders of vagina: Secondary | ICD-10-CM | POA: Diagnosis not present

## 2022-08-24 DIAGNOSIS — N9489 Other specified conditions associated with female genital organs and menstrual cycle: Secondary | ICD-10-CM | POA: Diagnosis not present

## 2022-08-24 DIAGNOSIS — N938 Other specified abnormal uterine and vaginal bleeding: Secondary | ICD-10-CM | POA: Diagnosis not present

## 2022-08-31 ENCOUNTER — Telehealth: Payer: Self-pay | Admitting: *Deleted

## 2022-08-31 NOTE — Telephone Encounter (Signed)
Spoke with the patient regarding the referral to GYN oncology. Patient scheduled as new patient with Dr Ernestina Patches on 3/11 at 9:45 am. Patient given an arrival time of 9:15 am.  Explained to the patient the the doctor will perform a pelvic exam at this visit. Patient given the policy that only one visitor allowed and that visitor must be over 16 yrs are allowed in the [place]. Patient given the address/phone number for the clinic and that the center offers free valet service. Patient aware of the mask mandate.

## 2022-09-02 DIAGNOSIS — R6889 Other general symptoms and signs: Secondary | ICD-10-CM | POA: Diagnosis not present

## 2022-09-06 DIAGNOSIS — E538 Deficiency of other specified B group vitamins: Secondary | ICD-10-CM | POA: Diagnosis not present

## 2022-09-06 DIAGNOSIS — L02429 Furuncle of limb, unspecified: Secondary | ICD-10-CM | POA: Diagnosis not present

## 2022-09-06 DIAGNOSIS — I10 Essential (primary) hypertension: Secondary | ICD-10-CM | POA: Diagnosis not present

## 2022-09-06 DIAGNOSIS — Z299 Encounter for prophylactic measures, unspecified: Secondary | ICD-10-CM | POA: Diagnosis not present

## 2022-09-06 DIAGNOSIS — D649 Anemia, unspecified: Secondary | ICD-10-CM | POA: Diagnosis not present

## 2022-09-06 DIAGNOSIS — Z6835 Body mass index (BMI) 35.0-35.9, adult: Secondary | ICD-10-CM | POA: Diagnosis not present

## 2022-09-06 DIAGNOSIS — F17210 Nicotine dependence, cigarettes, uncomplicated: Secondary | ICD-10-CM | POA: Diagnosis not present

## 2022-09-16 ENCOUNTER — Telehealth: Payer: Self-pay

## 2022-09-16 ENCOUNTER — Encounter: Payer: Self-pay | Admitting: Psychiatry

## 2022-09-16 NOTE — Telephone Encounter (Signed)
Internal referral for SDOH specialty services ordered d/t SDOH alert in two areas, Food and housing insecurity.

## 2022-09-20 ENCOUNTER — Encounter: Payer: Self-pay | Admitting: Psychiatry

## 2022-09-20 ENCOUNTER — Inpatient Hospital Stay: Payer: Medicare HMO

## 2022-09-20 ENCOUNTER — Inpatient Hospital Stay: Payer: Medicare HMO | Attending: Psychiatry | Admitting: Psychiatry

## 2022-09-20 ENCOUNTER — Other Ambulatory Visit: Payer: Self-pay

## 2022-09-20 VITALS — BP 141/78 | HR 74 | Temp 97.8°F | Resp 14 | Wt 229.0 lb

## 2022-09-20 DIAGNOSIS — Z113 Encounter for screening for infections with a predominantly sexual mode of transmission: Secondary | ICD-10-CM | POA: Diagnosis not present

## 2022-09-20 DIAGNOSIS — N9089 Other specified noninflammatory disorders of vulva and perineum: Secondary | ICD-10-CM

## 2022-09-20 DIAGNOSIS — Z8619 Personal history of other infectious and parasitic diseases: Secondary | ICD-10-CM

## 2022-09-20 DIAGNOSIS — D398 Neoplasm of uncertain behavior of other specified female genital organs: Secondary | ICD-10-CM | POA: Diagnosis not present

## 2022-09-20 DIAGNOSIS — C519 Malignant neoplasm of vulva, unspecified: Secondary | ICD-10-CM | POA: Diagnosis not present

## 2022-09-20 DIAGNOSIS — N900 Mild vulvar dysplasia: Secondary | ICD-10-CM | POA: Diagnosis not present

## 2022-09-20 LAB — HIV ANTIBODY (ROUTINE TESTING W REFLEX): HIV Screen 4th Generation wRfx: NONREACTIVE

## 2022-09-20 MED ORDER — LIDOCAINE 5 % EX OINT: 1.0000 | TOPICAL_OINTMENT | CUTANEOUS | 0 refills | Status: DC | PRN

## 2022-09-20 NOTE — Patient Instructions (Signed)
It was a pleasure to see you in clinic today. - We took some biopsies today to see what is going on. - We did some testing for other infections. - Sent a prescription for topical lidocaine to help with pain/burning. - Return visit planned for 2 weeks to discuss results and next steps.  Thank you very much for allowing me to provide care for you today.  I appreciate your confidence in choosing our Gynecologic Oncology team at [facility].  If you have any questions about your visit today please call our office or send [place] a MyChart message and we will get back to you as soon as possible.

## 2022-09-20 NOTE — Progress Notes (Signed)
GYNECOLOGIC ONCOLOGY NEW PATIENT CONSULTATION  Date of Service: 09/20/2022 Referring Provider: Judy Pimple, NP [place],  [place] [address]   ASSESSMENT AND PLAN: Anna Case is a 52 y.o. woman with a 3 cm fungating vulvar mass.  Reviewed the nature of vulvar dysplasia and vulvar cancer.  Reviewed that the mainstay of treatment is typically surgical management.  Biopsies obtained today to better characterize the lesion.  Reviewed with patient that based on the visual appearance of the mass, I do have concern for invasive cancer.  However, if this were to be confirmed on biopsy to be a cancer, surgical resection would be feasible.  Bilateral groins are clinically negative for lymphadenopathy.  If cancer was confirmed, I would recommend a PET/CT prior to surgical intervention.  Given patient's recent trichomonas infection and in the setting of possible vulvar cancer, recommended additional STD testing as patient does not believe that she is up-to-date.  Gonorrhea/chlamydia swabs obtained from the cervix.  HIV and syphilis testing obtained via blood work.  Will follow-up on these results.  Given patient's tenderness from the lesion, prescription sent for topical lidocaine that she can apply as needed to help with pain and burning in this area.  Plan for patient to return in 2 weeks to review biopsy results and next steps.   A copy of this note was sent to the patient's referring provider.  Bernadene Bell, MD Gynecologic Oncology   Medical Decision Making I personally spent  TOTAL 50 minutes face-to-face and non-face-to-face in the care of this patient, which includes all pre, intra, and post visit time on the date of service.   ------------  CC: Vulvar mass  HISTORY OF PRESENT ILLNESS:  Anna Case is a 52 y.o. woman who is seen in consultation at the request of A, Victorino December, NP for evaluation of vulvar mass.  Patient presented to her OB/GYN on  07/31/2022 for gynecologic exam.  She had been referred to OB/GYN from her PCP due to ongoing heavy vaginal bleeding.  At that time, she reported a history of irritation and itching of her external genitalia.  She also noted a bump in her vagina for about 6 months.  Prior Pap smear in 2 years prior was noted to be ASCUS, high risk HPV negative, but with trichomonas positive.  On exam she was noted to have a 3 cm firm mobile mass of the right introitus that was inflamed and necrotic and patches on the surface.  She underwent a pelvic ultrasound on 08/13/2022 which noted an endometrial thickness of 4 mm and no other findings.  A Pap smear returned NILM. And she was treated for trichomonas and with steroids for her vulvar itching. Then patient re-presented on 08/24/2022, she reported that the topical steroids had improved the itching.  Vulvar exam showed improvement in previously affected areas.  Prior right-sided introital mass was reduced in size and primarily involving superficial tissues.  Today patient reports that she has noticed that the area was getting bigger and more sore over time.  She also wonders if it had some pus in it at 1 point.  She felt like the lesion got better on antibiotics but now has worsened again off of antibiotics.  The area burns when she urinates when then urine touches the skin on the outside.  She also reports constipation and bloating that has started since initiating iron supplementation.  She believes that she has not been tested for STDs recently as far she  knows other than the trichomonas testing.  She otherwise denies early satiety, significant weight loss, change in bladder habits.   PAST MEDICAL HISTORY: Past Medical History:  Diagnosis Date   Glaucoma    Hypertension     PAST SURGICAL HISTORY: Past Surgical History:  Procedure Laterality Date   COLONOSCOPY W/ POLYPECTOMY      OB/GYN HISTORY: OB History  Gravida Para Term Preterm AB Living  0 0 0 0 0 0  SAB  IAB Ectopic Multiple Live Births  0 0 0 0 0      Age at menarche: 37 Age at menopause: none Hx of HRT: none Hx of STI: Trichomonas, treated recently and negative TOC Last pap: 08/10/22 NILM History of abnormal pap smears: Unsure, but denies biopsy or CKC/LEEP; Pap 2 years ago was noted to be ASCUS, high risk HPV negative per outside notes  SCREENING STUDIES:  Last mammogram: 2023 Last colonoscopy: 2022 or 2023 (reports she is due in 3 years)  MEDICATIONS:  Current Outpatient Medications:    amLODipine (NORVASC) 5 MG tablet, Take 5 mg by mouth daily., Disp: , Rfl:    betamethasone dipropionate 0.05 % cream, Apply topically 2 (two) times daily., Disp: 45 g, Rfl: 2   brimonidine (ALPHAGAN) 0.2 % ophthalmic solution, Administer 1 drop to both eyes in the morning., Disp: , Rfl:    dorzolamide-timolol (COSOPT) 22.3-6.8 MG/ML ophthalmic solution, Administer 1 drop to both eyes Two (2) times a day., Disp: , Rfl:    ROCKLATAN 0.02-0.005 % SOLN, Apply 1 drop to eye at bedtime., Disp: , Rfl:    tranexamic acid (LYSTEDA) 650 MG TABS tablet, Take by mouth., Disp: , Rfl:   ALLERGIES: No Known Allergies  FAMILY HISTORY: Family History  Problem Relation Age of Onset   Diabetes Mother    Heart attack Father    Breast cancer Other        2nd cousin (maternal), female   Prostate cancer Maternal Uncle    Prostate cancer Paternal Uncle    Colon cancer Neg Hx    Ovarian cancer Neg Hx    Endometrial cancer Neg Hx    Pancreatic cancer Neg Hx     SOCIAL HISTORY: Social History   Socioeconomic History   Marital status: Single    Spouse name: Not on file   Number of children: Not on file   Years of education: Not on file   Highest education level: Not on file  Occupational History   Not on file  Tobacco Use   Smoking status: Every Day    Packs/day: 0.07    Years: 20.00    Total pack years: 1.40    Types: Cigarettes   Smokeless tobacco: Never  Substance and Sexual Activity   Alcohol  use: No   Drug use: No   Sexual activity: Not Currently    Partners: Male  Other Topics Concern   Not on file  Social History Narrative   Not on file   Social Determinants of Health   Financial Resource Strain: Not on file  Food Insecurity: Food Insecurity Present (09/16/2022)   Hunger Vital Sign    Worried About Running Out of Food in the Last Year: Sometimes true    Ran Out of Food in the Last Year: Sometimes true  Transportation Needs: No Transportation Needs (09/16/2022)   PRAPARE - Hydrologist (Medical): No    Lack of Transportation (Non-Medical): No  Physical Activity: Not on file  Stress:  Not on file  Social Connections: Not on file  Intimate Partner Violence: Not At Risk (09/16/2022)   Humiliation, Afraid, Rape, and Kick questionnaire    Fear of Current or Ex-Partner: No    Emotionally Abused: No    Physically Abused: No    Sexually Abused: No    REVIEW OF SYSTEMS: New patient intake form was reviewed.  Complete 10-system review is negative except for the following: Menstrual problems, pelvic pain, fatigue, occasional hot flashes  PHYSICAL EXAM: BP (!) 141/78 (BP Location: Left Arm, Patient Position: Sitting)   Pulse 74   Temp 97.8 F (36.6 C) (Oral)   Resp 14   Wt 229 lb (103.9 kg)   SpO2 100%  Constitutional: No acute distress. Neuro/Psych: Alert, oriented.  Head and Neck: Normocephalic, atraumatic. Neck symmetric without masses. Sclera anicteric.  Respiratory: Normal work of breathing. Clear to auscultation bilaterally. Cardiovascular: Regular rate and rhythm, no murmurs, rubs, or gallops. Abdomen: Normoactive bowel sounds. Soft, non-distended, non-tender to palpation. No masses or hepatosplenomegaly appreciated.  Extremities: Grossly normal range of motion. Warm, well perfused. No edema bilaterally. Skin: No rashes or lesions. Lymphatic: No cervical, supraclavicular, or inguinal adenopathy. Genitourinary: Hyperpigmentation of the  bilateral groin areas and lateral vulva.  External genitalia with a 3 x 1.5cm fungating mass of the posterior medial right labia minora less than 2 cm from midline.  Mobile.  Dysplastic appearing changes extending to the hymen and across the posterior fourchette. On speculum exam, vagina and cervix without lesions.  GC/CT swab collected from cervical os.  Bimanual exam reveals normal cervix and small mobile uterus. Exam chaperoned by Joylene John, NP  VULVAR COLPOSCOPY WITH BIOPSY PROCEDURE NOTE  Procedure Details: After appropriate verbal informed consent was obtained, a timeout was performed. Acetic acid was applied to the vulva and the vulva was inspected with the colposcope with the findings as noted below. The area to be biopsied was cleansed with Betadine.  Using a Tischler biopsy forceps, a biopsy of the fungating mass was obtained.  Hemostasis was obtained with a figure-of-eight stitch of 2-0 Vicryl.  Additionally using a 3 mm punch biopsy a biopsy was obtained of the right posterior fourchette approaching the hymenal ring.  Hemostasis was obtained with silver nitrate.  The vulva was then cleansed of the acetic acid with water. The patient tolerated the procedure well.   Adequate Exam: Yes  Biopsy Specimen: Right vulvar mass biopsy, introitus/posterior fourchette biopsy  Condition: Stable. Patient tolerated procedure well.  Complications: None  Findings: 3 x 1.5 cm fungating mass of the medial posterior right labia minora.  Acetowhite changes extending from the mass towards the introitus and across the posterior fourchette to the left vulva.  Colposcopic Impression: Concerning for cancer, at least high grade dysplasia   LABORATORY AND RADIOLOGIC DATA: Outside medical records were reviewed to synthesize the above history, along with the history and physical obtained during the visit.  Outside laboratory, and imaging reports were reviewed, with pertinent results below.    No results  found for: "WBC", "HGB", "HCT", "PLT", "LDH", "MG", "CREATININE", "AST", "ALT", "CAN125", "CA125", "CEA", "AFPTM", "CA199", "HCGTM", "HPV"  Pelvic ultrasound (08/13/22): EXAM:  TRANSABDOMINAL AND TRANSVAGINAL ULTRASOUND OF PELVIS   TECHNIQUE:  Both transabdominal and transvaginal ultrasound examinations of the  pelvis were performed. Transabdominal technique was performed for  global imaging of the pelvis including uterus, ovaries, adnexal  regions, and pelvic cul-de-sac. It was necessary to proceed with  endovaginal exam following the transabdominal exam to visualize the  uterus, endometrium, ovaries and adnexa.   COMPARISON:  None Available.   FINDINGS:  Uterus   Measurements: 7.8 x 4.0 x 5.4 cm = volume: 87 mL. No fibroids or  other mass visualized.   Endometrium   Thickness: 4 mm.  No focal abnormality visualized.   Right ovary   Measurements: 3.6 x 2.1 x 2.6 cm = volume: 10.3 mL. Normal  appearance/no adnexal mass.   Left ovary   Measurements: 3.9 x 2.0 x 2.8 cm = volume: 11.7 mL. Normal  appearance/no adnexal mass.   Other findings   No abnormal free fluid.   IMPRESSION:  1. No sonographic etiology for dysfunctional uterine bleeding is  identified.  2. Endometrial thickness of 4 mm, within normal limits. In the  setting of post-menopausal bleeding, this is consistent with a  benign etiology such as endometrial atrophy. If bleeding remains  unresponsive to hormonal or medical therapy, sonohysterogram should  be considered for focal lesion work-up. (Ref: Radiological  Reasoning: Algorithmic Workup of Abnormal Vaginal Bleeding with  Endovaginal Sonography and Sonohysterography. [id number] [id number])

## 2022-09-21 ENCOUNTER — Inpatient Hospital Stay: Payer: Medicare HMO | Admitting: Licensed Clinical Social Worker

## 2022-09-21 ENCOUNTER — Telehealth: Payer: Self-pay | Admitting: Licensed Clinical Social Worker

## 2022-09-21 DIAGNOSIS — N938 Other specified abnormal uterine and vaginal bleeding: Secondary | ICD-10-CM | POA: Diagnosis not present

## 2022-09-21 DIAGNOSIS — N898 Other specified noninflammatory disorders of vagina: Secondary | ICD-10-CM | POA: Diagnosis not present

## 2022-09-21 LAB — GC/CHLAMYDIA PROBE AMP (~~LOC~~) NOT AT ARMC
Chlamydia: NEGATIVE
Comment: NEGATIVE
Comment: NORMAL
Neisseria Gonorrhea: NEGATIVE

## 2022-09-21 LAB — RPR: RPR Ser Ql: NONREACTIVE

## 2022-09-21 NOTE — Telephone Encounter (Signed)
[facility] Clinical Social Work  Clinical Social Work was referred by nurse for assessment of psychosocial needs.  Clinical Social Worker attempted to contact patient by phone  to offer support and assess for needs.   No answer. Left VM with direct contact information.     Dovey Fatzinger E Vannia Pola, LCSW  Clinical Social Worker [facility]        

## 2022-09-21 NOTE — Progress Notes (Signed)
[place] Work  Initial Assessment   Anna Case is a 52 y.o. year old female contacted by phone. Clinical Social Work was referred by nurse for assessment of psychosocial needs.   SDOH (Social Determinants of Health) assessments performed: Yes SDOH Interventions    Flowsheet Row Clinical Support from 09/21/2022 in [place] at [facility] Office Visit from 09/20/2022 in [facility] Gynecological Oncology  SDOH Interventions    Food Insecurity Interventions [id number] Referral, Other (Comment)  [local resource info] AMB Referral  Utilities Interventions [id number] Referral AMB Referral       SDOH Screenings   Food Insecurity: Food Insecurity Present (09/16/2022)  Housing: Low Risk  (09/16/2022)  Transportation Needs: No Transportation Needs (09/16/2022)  Utilities: At Risk (09/16/2022)  Depression (PHQ2-9): Low Risk  (09/16/2022)  Tobacco Use: High Risk (09/20/2022)     Distress Screen completed: No     No data to display            Family/Social Information:  Housing Arrangement: patient lives alone. She is disabled (visually impaired). Mom helps sometimes, but is not always available Family members/support persons in your life? Family Transportation concerns: yes, does not drive herself due to vision. Family helps some. Has used [employer] previously  Employment: Legally disabled .  Income source: Banker concerns: Yes, current concerns Type of concern: Animal nutritionist access concerns: yes, receives $20/month SNAP and has card from [employer], but does not cover all food needs Religious or spiritual practice: Not known Services Currently in place:  SNAP, [employer] card, [employer]  Coping/ Adjustment to diagnosis: Patient understands treatment plan and what happens next? yes, waiting on biopsy results Patient reported stressors: Finances Current coping skills/ strengths: Supportive  family/friends  and Other: able to access community resources    SUMMARY: Current SDOH Barriers:  Financial constraints related to food and utilities  Clinical Social Work Clinical Goal(s):  Explore community resource options for unmet needs related to:  Sales promotion account executive  and Food Insecurity   Interventions: Informed patient of the support team roles and support services at [facility] Provided [place] contact information and encouraged patient to call with any questions or concerns Referred patient to Guadalupe Guerra, [facility] Provided information for [place] for utility and food support [employer] on Wheels- pt does not qualify due to age (must be 31+)   Follow Up Plan: Patient will contact CSW with any support or resource needs. If pt receives a cancer diagnosis, CSW will refer to [facility] Patient verbalizes understanding of plan: Yes    Anna Therriault E Marielis Samara, LCSW

## 2022-09-22 ENCOUNTER — Telehealth: Payer: Self-pay

## 2022-09-22 NOTE — Telephone Encounter (Signed)
-----   Message from Dorothyann Gibbs, NP sent at 09/22/2022  7:47 AM EDT ----- Please let the patient know her gonorrhea, chlamydia, and HIV testing were negative. ----- Message ----- From: Interface, [facility] Sent: 09/21/2022   4:25 PM EDT To: Dorothyann Gibbs, NP

## 2022-09-22 NOTE — Telephone Encounter (Signed)
Pt aware of negative results  

## 2022-09-22 NOTE — Telephone Encounter (Signed)
Lvm for pt to call office regarding lab results

## 2022-09-28 ENCOUNTER — Other Ambulatory Visit: Payer: Self-pay | Admitting: Psychiatry

## 2022-09-28 DIAGNOSIS — C519 Malignant neoplasm of vulva, unspecified: Secondary | ICD-10-CM

## 2022-09-28 NOTE — Progress Notes (Signed)
Called pt regarding path results. PET ordered.

## 2022-09-29 ENCOUNTER — Telehealth: Payer: Self-pay | Admitting: Oncology

## 2022-09-29 NOTE — Telephone Encounter (Signed)
Called Anna Case with PET scan appointment at [facility] on 10/07/22 at 11:30.  Gave her instructions for NPO 6 hours prior except for water.  She verbalized understanding and agreement of appointment and instructions.  She also asked if she needs to keep the appointment with Dr. Ernestina Patches on 10/04/22 or if it needs to be rescheduled for after the PET scan. Advised that I will check with Dr. Ernestina Patches and call her back.

## 2022-09-30 NOTE — Telephone Encounter (Signed)
Called Anna Case and rescheduled follow up with Dr. Ernestina Patches to 10/11/22 at 2:45.  She verbalized understanding of new appointment.

## 2022-10-04 ENCOUNTER — Inpatient Hospital Stay: Payer: Medicare HMO | Admitting: Psychiatry

## 2022-10-06 DIAGNOSIS — R6889 Other general symptoms and signs: Secondary | ICD-10-CM | POA: Diagnosis not present

## 2022-10-07 ENCOUNTER — Encounter: Payer: Self-pay | Admitting: Psychiatry

## 2022-10-07 ENCOUNTER — Ambulatory Visit (HOSPITAL_COMMUNITY)
Admission: RE | Admit: 2022-10-07 | Discharge: 2022-10-07 | Disposition: A | Discharge status: Home / Self Care | Payer: Medicare HMO | Source: Ambulatory Visit | Attending: Psychiatry | Admitting: Psychiatry

## 2022-10-07 ENCOUNTER — Encounter (HOSPITAL_COMMUNITY): Payer: Self-pay

## 2022-10-07 DIAGNOSIS — C519 Malignant neoplasm of vulva, unspecified: Secondary | ICD-10-CM | POA: Insufficient documentation

## 2022-10-08 ENCOUNTER — Telehealth: Payer: Self-pay | Admitting: *Deleted

## 2022-10-08 NOTE — Telephone Encounter (Signed)
Patient's appt with Dr Ernestina Patches moved from 4/1 to 4/15 since PET scan was moved from 3/28 to 4/11. Scan moved due to patient had gum

## 2022-10-11 ENCOUNTER — Telehealth: Payer: Self-pay | Admitting: Oncology

## 2022-10-11 ENCOUNTER — Inpatient Hospital Stay: Payer: Medicare HMO | Admitting: Psychiatry

## 2022-10-11 NOTE — Telephone Encounter (Signed)
Left a message to see if she would be able to go to another location for her PET scan.  Requested a return call to discuss.

## 2022-10-11 NOTE — Telephone Encounter (Signed)
Called Anna Case and advised her of rescheduled PET scan at [facility] on 10/13/22 at 10:30.  Reviewed directions for NPO 6 hours prior except for sips of water and no candy or gum.  Also rescheduled appointment with Dr. Ernestina Patches to 10/18/22 at 3:15.  Discussed that Dr. Ernestina Patches is planning on surgery at [facility] on 10/21/22 if everything looks good and will discuss more at her appointment on 10/18/22.  [name] verbalized understanding and agreement.

## 2022-10-11 NOTE — Telephone Encounter (Signed)
Thanks

## 2022-10-13 ENCOUNTER — Ambulatory Visit
Admission: RE | Admit: 2022-10-13 | Discharge: 2022-10-13 | Disposition: A | Discharge status: Home / Self Care | Payer: Medicare HMO | Source: Ambulatory Visit | Attending: Psychiatry | Admitting: Psychiatry

## 2022-10-13 DIAGNOSIS — E041 Nontoxic single thyroid nodule: Secondary | ICD-10-CM | POA: Diagnosis not present

## 2022-10-13 DIAGNOSIS — R590 Localized enlarged lymph nodes: Secondary | ICD-10-CM | POA: Insufficient documentation

## 2022-10-13 DIAGNOSIS — R9389 Abnormal findings on diagnostic imaging of other specified body structures: Secondary | ICD-10-CM | POA: Diagnosis not present

## 2022-10-13 DIAGNOSIS — R6889 Other general symptoms and signs: Secondary | ICD-10-CM | POA: Diagnosis not present

## 2022-10-13 DIAGNOSIS — C519 Malignant neoplasm of vulva, unspecified: Secondary | ICD-10-CM | POA: Insufficient documentation

## 2022-10-13 LAB — GLUCOSE, CAPILLARY: Glucose-Capillary: 123 mg/dL — ABNORMAL HIGH (ref 70–99)

## 2022-10-13 MED ORDER — FLUDEOXYGLUCOSE F - 18 (FDG) INJECTION
12.4300 | Freq: Once | INTRAVENOUS | Status: AC | PRN
  Administered 2022-10-13: 12.43 via INTRAVENOUS

## 2022-10-13 NOTE — Progress Notes (Signed)
This encounter was created in error - please disregard.  Patient rescheduled

## 2022-10-15 ENCOUNTER — Encounter: Payer: Self-pay | Admitting: Psychiatry

## 2022-10-15 ENCOUNTER — Other Ambulatory Visit (HOSPITAL_COMMUNITY): Payer: Medicare HMO

## 2022-10-18 ENCOUNTER — Inpatient Hospital Stay: Payer: Medicare HMO | Attending: Psychiatry | Admitting: Psychiatry

## 2022-10-18 ENCOUNTER — Other Ambulatory Visit: Payer: Self-pay

## 2022-10-18 VITALS — BP 138/78 | HR 89 | Temp 98.3°F | Wt 233.0 lb

## 2022-10-18 DIAGNOSIS — R9389 Abnormal findings on diagnostic imaging of other specified body structures: Secondary | ICD-10-CM | POA: Diagnosis not present

## 2022-10-18 DIAGNOSIS — R7303 Prediabetes: Secondary | ICD-10-CM | POA: Insufficient documentation

## 2022-10-18 DIAGNOSIS — N950 Postmenopausal bleeding: Secondary | ICD-10-CM | POA: Insufficient documentation

## 2022-10-18 DIAGNOSIS — R946 Abnormal results of thyroid function studies: Secondary | ICD-10-CM | POA: Insufficient documentation

## 2022-10-18 DIAGNOSIS — Z9079 Acquired absence of other genital organ(s): Secondary | ICD-10-CM | POA: Diagnosis not present

## 2022-10-18 DIAGNOSIS — E042 Nontoxic multinodular goiter: Secondary | ICD-10-CM | POA: Diagnosis not present

## 2022-10-18 DIAGNOSIS — F17210 Nicotine dependence, cigarettes, uncomplicated: Secondary | ICD-10-CM | POA: Insufficient documentation

## 2022-10-18 DIAGNOSIS — N771 Vaginitis, vulvitis and vulvovaginitis in diseases classified elsewhere: Secondary | ICD-10-CM | POA: Diagnosis not present

## 2022-10-18 DIAGNOSIS — C541 Malignant neoplasm of endometrium: Secondary | ICD-10-CM | POA: Diagnosis not present

## 2022-10-18 DIAGNOSIS — N9089 Other specified noninflammatory disorders of vulva and perineum: Secondary | ICD-10-CM | POA: Diagnosis not present

## 2022-10-18 DIAGNOSIS — L280 Lichen simplex chronicus: Secondary | ICD-10-CM | POA: Diagnosis not present

## 2022-10-18 DIAGNOSIS — R948 Abnormal results of function studies of other organs and systems: Secondary | ICD-10-CM | POA: Insufficient documentation

## 2022-10-18 DIAGNOSIS — R739 Hyperglycemia, unspecified: Secondary | ICD-10-CM | POA: Diagnosis not present

## 2022-10-18 DIAGNOSIS — C519 Malignant neoplasm of vulva, unspecified: Secondary | ICD-10-CM | POA: Diagnosis not present

## 2022-10-18 DIAGNOSIS — L299 Pruritus, unspecified: Secondary | ICD-10-CM

## 2022-10-18 DIAGNOSIS — L859 Epidermal thickening, unspecified: Secondary | ICD-10-CM

## 2022-10-18 NOTE — Patient Instructions (Addendum)
-  Today Dr. Alvester Morin did an endometrial biopsy which is a biopsy from the lining of the uterus.   -You can use over the counter hydrocortisone topical cream where you are itching on your thighs but do not place in the vagina.  -You can also apply the topical lidocaine which is a numbing medication to the area on your right thigh as well.  -Plan on having a thyroid ultrasound and you will be contacted with the results.   First thing that will happen on Thursday will be going to radiology. Dr. Alvester Morin would like for you to put the numbing gel on the vulvar lesion prior to this to help with discomfort.  The surgery on Thursday will be a radical vulvectomy and sentinel lymph node biopsy, possible lymph node dissection. We will also do a dilation and curettage of the uterus (dilation of the cervix and scraping/thinning of the lining of the uterus).   NPO (nothing to eat) after midnight on Wed. Clear liquids up until 3 hours before your surgery. Arrive at 10 am at [facility] on Thursday morning. Plan on staying one night in the hospital.

## 2022-10-18 NOTE — Progress Notes (Unsigned)
Gynecologic Oncology Return Clinic Visit  Date of Service: 10/18/2022 Referring Provider: Mickeal Skinner, NP [address] [place],  [place] [address]  Assessment & Plan: Anna Case is a 52 y.o. 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  Vulvar cancer: - We reviewed the nature of vulvar cancer and the recommend treatment of surgical staging.   - Surgery would include a radical vulvectomy with bilateral inguinal lymph node sentinel biopsy and possible completion lymphadenectomy if no sentinel lymph node identified. Discussed that given lesion is w/n 2cm of midline, would recommend bilateral sentinel evaluation. However, given that lesion does not meet or cross midline, would not recommend contralateral lymphadenectomy if no mapping on the left side but would recommend lymphadenectomy on the right if no mapping. - Following surgery, some patients may require adjuvant treatment with radiation depending on final pathology. - Patient was verbally consented for: pelvic exam under anesthesia, partial radical vulvectomy, bilateral inguinofemoral sentinel lymph node evaluation and biopsy, possible completion lymph node dissection, dilation and curettage, intrauterine device insertion on 10/21/22 at [facility]. - The risks of surgery were discussed in detail and she understands these to including but not limited to bleeding requiring a blood transfusion, infection, injury to adjacent organs (including but not limited to the vagina, anus, urethra, blood vessels and nerves), wound separation, possible risk of lymphedema and lymphocyst if lymphadenectomy performed, unforseen complication, and possible need for re-exploration. - If the patient experiences any of these events, she understands that her hospitalization or recovery may be prolonged and that she may need to  take additional medications for a prolonged period. The patient will receive DVT and antibiotic prophylaxis as indicated. She voiced a clear understanding. She had the opportunity to ask questions and informed consent was obtained today. She wishes to proceed. - Plan CBC, BMP, T&S, ABO on DOS at [facility].  - She does not require preoperative clearance. Her METs are >4.  - All preoperative instructions were reviewed. Postoperative expectations were also reviewed.  - Plan one night observation postoperatively.  Hypermetabolic endometrium on PET, AUB - Reviewed PET findings. Concern for endometrial cancer. - EMBx collected today. See procedure note below. - Discussed that if endometrial cancer confirmed, would recommend hysterectomy. Would not be able to perform this on current surgical date, so if one surgery planned, would need to move surgery to have OR time for all procedures.  - Pt not certain that she would accept hysterectomy at this time. Does not want to delay vulvectomy given symptoms. Discussed alternative option would be to proceed with vulvectomy as planned and include D&C and IUD insertion for treatment of endometrial cancer if confirmed. Discussed that I would not recommend this as definitive treatment but could do this in the meantime while healing from vulvectomy. 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 Pt notified. She does not wish to delay surgery to do all staging at one surgery as she is symptomatic from her vulvar cancer. Plan additional of D&C, IUD insertion at time of vulvectomy and will discuss endometrial cancer staging surgery once she has healed from vulvectomy.   Foal hypermetabolism of thyroid: - Discussed thyroid findings on PET. - Recommend thyroid ultrasound and biopsy. Thyroid ultrasound ordered.  Hyperkeratosis, skin itching: - Biopsy today to confirm diagnosis.  - Could be multifactorial including lichen simplex chronicus and/or  fungal infection. - Recommend  topical hydrocortisone cream for now to symptomatic management pending path. - Addendum: Path notes hyperkeratosis with mild chronic inflammation, differential includes lichen simplex chronicus, unusual drug reaction or fungus. Special stain for PAS fungus is pending. Pt can use topical steroids as previously instructed. Will follow-up fungus results.   RTC Postop.  [name], MD Gynecologic Oncology   Medical Decision Making I personally spent  TOTAL 45 minutes face-to-face and non-face-to-face in the care of this patient, which includes all pre, intra, and post visit time on the date of service.   ----------------------- Reason for Visit: Follow-up, Treatment counseling  Treatment History: Oncology History  Vulvar cancer  09/20/2022 Initial Biopsy   FINAL MICROSCOPIC DIAGNOSIS:  A. VULVAR MASS, RIGHT, BIOPSY:  - Invasive well to moderately differentiated squamous cell carcinoma,  see comment  B. INTROITUS, BIOPSY:  - Low-grade squamous intraepithelial lesion (VIN1, condyloma), see  comment    09/20/2022 Initial Diagnosis   Vulvar cancer   10/13/2022 Imaging   PET: IMPRESSION: Postop changes from right vulvectomy. No evidence of residual or recurrent vulvar mass.   Intense hypermetabolic activity throughout the endometrial cavity of the uterus, highly suspicious for endometrial carcinoma.   Sub-cm bilateral inguinal lymph nodes show mild hypermetabolism. Metastatic disease cannot be excluded.   Sub-centimeter bilateral axillary lymph nodes show mild hypermetabolism, and are indeterminate.   Focal hypermetabolism in lateral right thyroid lobe. Thyroid carcinoma cannot be excluded. Recommend thyroid US and biopsy. (ref: J Am Coll Radiol. 2015 Feb;12(2): 143-50).   Sub-cm bilateral upper neck lymph nodes show mild hypermetabolism. Metastatic disease cannot be excluded, possibly from thyroid carcinoma if the hypermetabolic right  thyroid nodule is malignant.     Interval History: Since her last visit, pt reports increased vaginal bleeding. She also notes persistent vulvar discomfort from her mass and itching of the vulva and inner thighs.  Past Medical/Surgical History: Past Medical History:  Diagnosis Date   Glaucoma    Hypertension     Past Surgical History:  Procedure Laterality Date   COLONOSCOPY W/ POLYPECTOMY      Family History  Problem Relation Age of Onset   Diabetes Mother    Heart attack Father    Breast cancer Other        2nd cousin (maternal), female   Prostate cancer Maternal Uncle    Prostate cancer Paternal Uncle    Colon cancer Neg Hx    Ovarian cancer Neg Hx    Endometrial cancer Neg Hx    Pancreatic cancer Neg Hx     Social History   Socioeconomic History   Marital status: Single    Spouse name: Not on file   Number of children: Not on file   Years of education: Not on file   Highest education level: Not on file  Occupational History   Not on file  Tobacco Use   Smoking status: Every Day    Packs/day: 0.07    Years: 20.00    Additional pack years: 0.00    Total pack years: 1.40    Types: Cigarettes   Smokeless tobacco: Never  Substance and Sexual Activity   Alcohol use: No   Drug use: No   Sexual activity: Not Currently    Partners: Male  Other Topics Concern   Not on file  Social History Narrative   Not on file   Social Determinants of Health   Financial Resource Strain: Not on file  Food Insecurity: Food Insecurity Present (09/16/2022)   Hunger Vital Sign  Worried About Programme researcher, broadcasting/film/videounning Out of Food in the Last Year: Sometimes true    [employer] Out of Food in the Last Year: Sometimes true  Transportation Needs: No Transportation Needs (09/16/2022)   PRAPARE - Administrator, Civil ServiceTransportation    Lack of Transportation (Medical): No    Lack of Transportation (Non-Medical): No  Physical Activity: Not on file  Stress: Not on file  Social Connections: Not on file    Current  Medications:  Current Outpatient Medications:    amLODipine (NORVASC) 5 MG tablet, Take 5 mg by mouth daily., Disp: , Rfl:    betamethasone dipropionate 0.05 % cream, Apply topically 2 (two) times daily., Disp: 45 g, Rfl: 2   brimonidine (ALPHAGAN) 0.2 % ophthalmic solution, Administer 1 drop to both eyes in the morning., Disp: , Rfl:    dorzolamide-timolol (COSOPT) 22.3-6.8 MG/ML ophthalmic solution, Administer 1 drop to both eyes Two (2) times a day., Disp: , Rfl:    lidocaine (XYLOCAINE) 5 % ointment, Apply 1 Application topically as needed., Disp: 35.44 g, Rfl: 0   ROCKLATAN 0.02-0.005 % SOLN, Apply 1 drop to eye at bedtime., Disp: , Rfl:    tranexamic acid (LYSTEDA) 650 MG TABS tablet, Take by mouth., Disp: , Rfl:   Review of Symptoms: Complete 10-system review is negative except as above in Interval History.  Physical Exam: BP 138/78 (BP Location: Left Arm, Patient Position: Sitting)   Pulse 89   Temp 98.3 F (36.8 C) (Oral)   Wt 233 lb (105.7 kg)   SpO2 100%  General: Alert, oriented, no acute distress. HEENT: Normocephalic, atraumatic. Neck symmetric without masses. Sclera anicteric.  Chest: Normal work of breathing. Clear to auscultation bilaterally.   Cardiovascular: Regular rate and rhythm, no murmurs. Abdomen: Soft, nontender.  Normoactive bowel sounds.  No masses. Extremities: Grossly normal range of motion.  Warm, well perfused.  No edema bilaterally. Skin: No rashes or lesions noted. Lymphatics: No cervical, supraclavicular, or inguinal adenopathy. GU: Hyperpigmentation and hyperkeratosis of the bilateral groin areas, lateral vulva and inner thighs.  External genitalia with a 3 x 1.5cm fungating mass of the posterior medial right labia minora less than 2 cm from midline.  Mobile.  On speculum exam, vagina and cervix without lesions. Dark blood in vaginal vault and slow bleeding exiting cervix. Bimanual exam reveals normal cervix and small mobile uterus. Exam chaperoned by  Warner MccreedyMelissa Cross, NP   EMB Procedure: After appropriate verbal informed consent was obtained, a timeout was performed. A sterile speculum was placed in the vagina, and the area was cleaned with betadine x3. A single-tooth tenaculum was placed on the anterior lip of the cervix.  An endometrial biopsy pipelle was advanced carefully to the uterine fundus which sounded to 8cm. An adequate sample was obtained over 2 passes. The tenaculum was removed, and tenaculum sites were noted to be hemostatic. The speculum was removed from the vagina. The patient tolerated the procedure well.   UPT: not indicated  SKIN BIOPSY The procedure was explained to the patient and verbal consent was obtained prior to the procedure.  The skin of the proximal medial thigh was cleaned with Betadine.  2 ml of 1% lidocaine was injected at the planned biopsy site.  A 3 mm punch biopsy was used to obtain the biopsy specimen.  Hemostasis was achieved with silver nitrate.  Patient tolerated the procedure well.   Laboratory & Radiologic Studies: NM PET Image Initial (PI) Skull Base To Thigh (F-18 FDG) 10/13/2022  Narrative CLINICAL DATA:  Initial treatment strategy  for vulvar carcinoma.  EXAM: NUCLEAR MEDICINE PET SKULL BASE TO THIGH  TECHNIQUE: 12.4 mCi F-18 FDG was injected intravenously. Full-ring PET imaging was performed from the skull base to thigh after the radiotracer. CT data was obtained and used for attenuation correction and anatomic localization.  Fasting blood glucose: 123 mg/dl  COMPARISON:  None Available.  FINDINGS: Mediastinal blood-pool activity (background): SUV max = 2.9  Liver activity (reference): SUV max = N/A  NECK: 8 mm right level-II upper jugular lymph node is seen on image 19/4 shows mild hypermetabolism, with SUV max of 3.9. A 7 mm lymph node in the left upper neck superficial to the sternocleidomastoid is seen on image 22/4, which shows mild FDG uptake with SUV max of 2.4.  Focal  area of hypermetabolism is seen in the lateral right thyroid lobe which has SUV max of 5.6.  Incidental CT findings:  None.  CHEST: No hypermetabolic mediastinal or hilar lymph nodes lymph nodes. Shotty sub-cm lymph nodes are seen in both axillary regions which show mild FDG uptake which is symmetric. No pathologically enlarged lymph nodes identified. No suspicious pulmonary nodules seen on CT images.  Incidental CT findings:  None.  ABDOMEN/PELVIS: No abnormal hypermetabolic activity within the liver, pancreas, adrenal glands, or spleen.  Intense hypermetabolic activity is seen throughout the endometrial cavity of the uterus, with SUV max of 20.0.  Sub-centimeter lymph nodes are seen in inguinal regions bilaterally which show hypermetabolism. Index lymph node in the right inguinal region measures 7 mm on image 152/4, with SUV max of 3.8. No other hypermetabolic lymphadenopathy identified the pelvis or abdomen.  Postsurgical changes are seen from right vulvectomy, without evidence residual or recurrent hypermetabolic mass at this site.  Incidental CT findings:  None.  SKELETON: No focal hypermetabolic bone lesions to suggest skeletal metastasis.  Incidental CT findings:  None.  IMPRESSION: Postop changes from right vulvectomy. No evidence of residual or recurrent vulvar mass.  Intense hypermetabolic activity throughout the endometrial cavity of the uterus, highly suspicious for endometrial carcinoma.  Sub-cm bilateral inguinal lymph nodes show mild hypermetabolism. Metastatic disease cannot be excluded.  Sub-centimeter bilateral axillary lymph nodes show mild hypermetabolism, and are indeterminate.  Focal hypermetabolism in lateral right thyroid lobe. Thyroid carcinoma cannot be excluded. Recommend thyroid US and biopsy. (ref: J Am Coll Radiol. 2015 Feb;12(2): 143-50).  Sub-cm bilateral upper neck lymph nodes show mild hypermetabolism. Metastatic disease cannot be  excluded, possibly from thyroid carcinoma if the hypermetabolic right thyroid nodule is malignant.   Electronically Signed By: Danae Orleans M.D. On: 10/14/2022 09:22

## 2022-10-19 LAB — SURGICAL PATHOLOGY

## 2022-10-20 ENCOUNTER — Encounter: Payer: Self-pay | Admitting: Psychiatry

## 2022-10-20 DIAGNOSIS — C541 Malignant neoplasm of endometrium: Secondary | ICD-10-CM | POA: Insufficient documentation

## 2022-10-20 DIAGNOSIS — C519 Malignant neoplasm of vulva, unspecified: Secondary | ICD-10-CM | POA: Insufficient documentation

## 2022-10-21 ENCOUNTER — Other Ambulatory Visit (HOSPITAL_COMMUNITY): Payer: Medicare HMO

## 2022-10-21 DIAGNOSIS — Z30430 Encounter for insertion of intrauterine contraceptive device: Secondary | ICD-10-CM | POA: Diagnosis not present

## 2022-10-21 DIAGNOSIS — I10 Essential (primary) hypertension: Secondary | ICD-10-CM | POA: Diagnosis not present

## 2022-10-21 DIAGNOSIS — C519 Malignant neoplasm of vulva, unspecified: Secondary | ICD-10-CM | POA: Diagnosis not present

## 2022-10-21 DIAGNOSIS — C55 Malignant neoplasm of uterus, part unspecified: Secondary | ICD-10-CM | POA: Diagnosis not present

## 2022-10-21 DIAGNOSIS — C541 Malignant neoplasm of endometrium: Secondary | ICD-10-CM | POA: Diagnosis not present

## 2022-10-21 DIAGNOSIS — H409 Unspecified glaucoma: Secondary | ICD-10-CM | POA: Diagnosis not present

## 2022-10-21 LAB — SURGICAL PATHOLOGY

## 2022-10-22 ENCOUNTER — Telehealth: Payer: Self-pay | Admitting: Oncology

## 2022-10-22 ENCOUNTER — Other Ambulatory Visit (HOSPITAL_COMMUNITY): Payer: Medicare HMO

## 2022-10-22 NOTE — Telephone Encounter (Signed)
Called Anna Case and advised her of the post op appointment with Dr. Alvester Morin on 11/01/22 at 3:30.  She verbalized understanding and agreement of the appointment.

## 2022-10-25 ENCOUNTER — Ambulatory Visit: Payer: Medicare HMO | Admitting: Psychiatry

## 2022-10-26 ENCOUNTER — Telehealth: Payer: Self-pay | Admitting: Oncology

## 2022-10-26 ENCOUNTER — Other Ambulatory Visit: Payer: Self-pay | Admitting: Psychiatry

## 2022-10-26 ENCOUNTER — Telehealth: Payer: Self-pay | Admitting: Psychiatry

## 2022-10-26 DIAGNOSIS — H401133 Primary open-angle glaucoma, bilateral, severe stage: Secondary | ICD-10-CM | POA: Diagnosis not present

## 2022-10-26 DIAGNOSIS — H2513 Age-related nuclear cataract, bilateral: Secondary | ICD-10-CM | POA: Diagnosis not present

## 2022-10-26 DIAGNOSIS — H04123 Dry eye syndrome of bilateral lacrimal glands: Secondary | ICD-10-CM | POA: Diagnosis not present

## 2022-10-26 DIAGNOSIS — L83 Acanthosis nigricans: Secondary | ICD-10-CM

## 2022-10-26 DIAGNOSIS — B372 Candidiasis of skin and nail: Secondary | ICD-10-CM

## 2022-10-26 MED ORDER — CLOTRIMAZOLE 1 % EX CREA
1.0000 | TOPICAL_CREAM | Freq: Two times a day (BID) | CUTANEOUS | 0 refills | Status: DC
Start: 2022-10-26 — End: 2023-10-05

## 2022-10-26 MED ORDER — HYDROCORTISONE 2.5 % EX CREA
TOPICAL_CREAM | CUTANEOUS | 1 refills | Status: AC
Start: 2022-10-26 — End: 2022-11-16

## 2022-10-26 NOTE — Telephone Encounter (Signed)
Called Anna Case and advised her that per Dr. Alvester Morin -  The biopsy from the thigh returned with chronic skin changes due to yeast infection. I recommend treatment with topical fungal cream and topical steroid for itching. This should be applied to the groins and the thighs where the skin changes are present, but not directly over the right groin skin incision.    Two medications were sent to her pharmacy: Clotrimazole cream: She should apply this twice daily for 4 weeks. Hydrocortisone cream: If she is having itching, she can applied this twice daily for one week, then daily for one week, then every other day for one week.   Also reviewed upcoming appointments and set up a lab appointment to check a Hgb A1C after her [place] on 10/28/22.  She verbalized understanding and agreement of all appointments and instructions.

## 2022-10-26 NOTE — Telephone Encounter (Signed)
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  Attempted to call pt. No answer.  Will see if clinic staff can reach pt and share the following info: The biopsy from the thigh returned with chronic skin changes due to yeast infection. I recommend treatment with topical fungal cream and topical steroid for itching. This should be applied to the groins and the thighs where the skin changes are present, but not directly over the right groin skin incision.   Two medications were sent to her pharmacy: Clotrimazole cream: She should apply this twice daily for 4 weeks. Hydrocortisone cream: If she is having itching, she can applied this twice daily for one week, then daily for one week, then every other day for one week.  Also, if she has never been tested for diabetes, we should get this assessed. Will place order for A1c lab to be drawn.

## 2022-10-28 ENCOUNTER — Inpatient Hospital Stay: Payer: Medicare HMO

## 2022-10-28 ENCOUNTER — Other Ambulatory Visit: Payer: Self-pay

## 2022-10-28 ENCOUNTER — Ambulatory Visit (HOSPITAL_COMMUNITY)
Admission: RE | Admit: 2022-10-28 | Discharge: 2022-10-28 | Disposition: A | Discharge status: Home / Self Care | Payer: Medicare HMO | Source: Ambulatory Visit | Attending: Psychiatry | Admitting: Psychiatry

## 2022-10-28 DIAGNOSIS — N950 Postmenopausal bleeding: Secondary | ICD-10-CM | POA: Diagnosis not present

## 2022-10-28 DIAGNOSIS — L83 Acanthosis nigricans: Secondary | ICD-10-CM

## 2022-10-28 DIAGNOSIS — C519 Malignant neoplasm of vulva, unspecified: Secondary | ICD-10-CM | POA: Diagnosis not present

## 2022-10-28 DIAGNOSIS — L859 Epidermal thickening, unspecified: Secondary | ICD-10-CM | POA: Diagnosis not present

## 2022-10-28 DIAGNOSIS — R946 Abnormal results of thyroid function studies: Secondary | ICD-10-CM | POA: Diagnosis not present

## 2022-10-28 DIAGNOSIS — R948 Abnormal results of function studies of other organs and systems: Secondary | ICD-10-CM | POA: Insufficient documentation

## 2022-10-28 DIAGNOSIS — L299 Pruritus, unspecified: Secondary | ICD-10-CM | POA: Diagnosis not present

## 2022-10-28 DIAGNOSIS — F17210 Nicotine dependence, cigarettes, uncomplicated: Secondary | ICD-10-CM | POA: Diagnosis not present

## 2022-10-28 DIAGNOSIS — R739 Hyperglycemia, unspecified: Secondary | ICD-10-CM | POA: Diagnosis not present

## 2022-10-28 DIAGNOSIS — E041 Nontoxic single thyroid nodule: Secondary | ICD-10-CM | POA: Diagnosis not present

## 2022-10-28 DIAGNOSIS — R9389 Abnormal findings on diagnostic imaging of other specified body structures: Secondary | ICD-10-CM | POA: Diagnosis not present

## 2022-10-28 LAB — HEMOGLOBIN A1C
Hgb A1c MFr Bld: 5.7 % — ABNORMAL HIGH (ref 4.8–5.6)
Mean Plasma Glucose: 116.89 mg/dL

## 2022-10-29 ENCOUNTER — Encounter: Payer: Self-pay | Admitting: Psychiatry

## 2022-11-01 ENCOUNTER — Other Ambulatory Visit: Payer: Self-pay

## 2022-11-01 ENCOUNTER — Inpatient Hospital Stay (HOSPITAL_BASED_OUTPATIENT_CLINIC_OR_DEPARTMENT_OTHER): Payer: Medicare HMO | Admitting: Psychiatry

## 2022-11-01 VITALS — BP 129/70 | HR 84 | Temp 98.5°F | Wt 231.5 lb

## 2022-11-01 DIAGNOSIS — L859 Epidermal thickening, unspecified: Secondary | ICD-10-CM | POA: Diagnosis not present

## 2022-11-01 DIAGNOSIS — R9389 Abnormal findings on diagnostic imaging of other specified body structures: Secondary | ICD-10-CM | POA: Diagnosis not present

## 2022-11-01 DIAGNOSIS — R948 Abnormal results of function studies of other organs and systems: Secondary | ICD-10-CM | POA: Diagnosis not present

## 2022-11-01 DIAGNOSIS — B369 Superficial mycosis, unspecified: Secondary | ICD-10-CM | POA: Diagnosis not present

## 2022-11-01 DIAGNOSIS — L299 Pruritus, unspecified: Secondary | ICD-10-CM | POA: Diagnosis not present

## 2022-11-01 DIAGNOSIS — Z7189 Other specified counseling: Secondary | ICD-10-CM | POA: Diagnosis not present

## 2022-11-01 DIAGNOSIS — C519 Malignant neoplasm of vulva, unspecified: Secondary | ICD-10-CM | POA: Diagnosis not present

## 2022-11-01 DIAGNOSIS — E041 Nontoxic single thyroid nodule: Secondary | ICD-10-CM | POA: Diagnosis not present

## 2022-11-01 DIAGNOSIS — C541 Malignant neoplasm of endometrium: Secondary | ICD-10-CM | POA: Diagnosis not present

## 2022-11-01 DIAGNOSIS — R7303 Prediabetes: Secondary | ICD-10-CM

## 2022-11-01 DIAGNOSIS — R739 Hyperglycemia, unspecified: Secondary | ICD-10-CM | POA: Diagnosis not present

## 2022-11-01 DIAGNOSIS — R946 Abnormal results of thyroid function studies: Secondary | ICD-10-CM | POA: Diagnosis not present

## 2022-11-01 DIAGNOSIS — L280 Lichen simplex chronicus: Secondary | ICD-10-CM

## 2022-11-01 DIAGNOSIS — N950 Postmenopausal bleeding: Secondary | ICD-10-CM | POA: Diagnosis not present

## 2022-11-01 DIAGNOSIS — F17210 Nicotine dependence, cigarettes, uncomplicated: Secondary | ICD-10-CM | POA: Diagnosis not present

## 2022-11-01 NOTE — Patient Instructions (Signed)
It was a pleasure to see you in clinic today. - Start doing sitz baths twice a day. - Return visit planned for 2 weeks.  Thank you very much for allowing me to provide care for you today.  I appreciate your confidence in choosing our Gynecologic Oncology team at [facility].  If you have any questions about your visit today please call our office or send [place] a MyChart message and we will get back to you as soon as possible.

## 2022-11-01 NOTE — Progress Notes (Unsigned)
Gynecologic Oncology Return Clinic Visit  Date of Service: 11/01/2022 Referring Provider: Orlene Plum, NP [address] [place],  [place] [address]***  Assessment & Plan: Anna Case is a 52 y.o. woman with Stage *** who is *** weeks s/p *** on ***.  Postop: - Pt recovering well from surgery and healing appropriately postoperatively - Intraoperative findings and pathology results reviewed. - Ongoing postoperative expectations and precautions reviewed. Continue with no lifting >10lbs through 6 weeks postoperatively - Pt works ***. Okay to return to work at [employer] - Given that uterus is in situ, pt advised that she should continue with pap smear screening per routine until age 24 if she continues with negative/low grade paps.  - Reviewed that after 12 months without menstrual cycles, she should not have any spotting or bleeding.  If this were to occur, she should be evaluated for postmenopausal bleeding.  ***  ***VTE Prophylaxis: - Khorana score = ***  RTC ***.  [name], MD Gynecologic Oncology   Medical Decision Making I personally spent  TOTAL *** minutes face-to-face and non-face-to-face in the care of this patient, which includes all pre, intra, and post visit time on the date of service.  *** minutes spent reviewing records prior to the visit *** Minutes in patient contact      *** minutes in other billable services *** minutes charting , conferring with consultants etc.   ----------------------- Reason for Visit: Postop***  Treatment History: Oncology History  Vulvar cancer  09/20/2022 Initial Biopsy   FINAL MICROSCOPIC DIAGNOSIS:  A. VULVAR MASS, RIGHT, BIOPSY:  - Invasive well to moderately differentiated squamous cell carcinoma,  see comment  B. INTROITUS, BIOPSY:  - Low-grade squamous intraepithelial lesion (VIN1, condyloma), see  comment    09/20/2022 Initial Diagnosis   Vulvar cancer   10/13/2022 Imaging   PET: IMPRESSION: Postop  changes from right vulvectomy. No evidence of residual or recurrent vulvar mass.   Intense hypermetabolic activity throughout the endometrial cavity of the uterus, highly suspicious for endometrial carcinoma.   Sub-cm bilateral inguinal lymph nodes show mild hypermetabolism. Metastatic disease cannot be excluded.   Sub-centimeter bilateral axillary lymph nodes show mild hypermetabolism, and are indeterminate.   Focal hypermetabolism in lateral right thyroid lobe. Thyroid carcinoma cannot be excluded. Recommend thyroid US and biopsy. (ref: J Am Coll Radiol. 2015 Feb;12(2): 143-50).   Sub-cm bilateral upper neck lymph nodes show mild hypermetabolism. Metastatic disease cannot be excluded, possibly from thyroid carcinoma if the hypermetabolic right thyroid nodule is malignant.   Endometrial cancer  10/18/2022 Initial Diagnosis   Endometrial cancer   10/18/2022 Initial Biopsy   FINAL MICROSCOPIC DIAGNOSIS:  A. ENDOMETRIUM, BIOPSY:  - Endometrioid carcinoma, FIGO grade 1  - See comment  COMMENT:  Part A: Immunohistochemical staining for p53 and MMR is pending and will  be reported in an addendum.  Dr. Reynolds Bowl reviewed the case and agrees with  the above diagnosis.  IHC EXPRESSION RESULTS  TEST           RESULT  MLH1:          Preserved nuclear expression  MSH2:          Preserved nuclear expression  MSH6:          Preserved nuclear expression  PMS2:          Preserved nuclear expression      Interval History: Pt reports that she is recovering well from surgery. She is using tylenol and motrin for pain. She is  eating and drinking well. She is voiding without issue and having regular bowel movements.   Cramping, light bleeding  Past Medical/Surgical History: Past Medical History:  Diagnosis Date   Glaucoma    Hypertension     Past Surgical History:  Procedure Laterality Date   COLONOSCOPY W/ POLYPECTOMY      Family History  Problem Relation Age of Onset   Diabetes Mother     Heart attack Father    Breast cancer Other        2nd cousin (maternal), female   Prostate cancer Maternal Uncle    Prostate cancer Paternal Uncle    Colon cancer Neg Hx    Ovarian cancer Neg Hx    Endometrial cancer Neg Hx    Pancreatic cancer Neg Hx     Social History   Socioeconomic History   Marital status: Single    Spouse name: Not on file   Number of children: Not on file   Years of education: Not on file   Highest education level: Not on file  Occupational History   Not on file  Tobacco Use   Smoking status: Every Day    Packs/day: 0.07    Years: 20.00    Additional pack years: 0.00    Total pack years: 1.40    Types: Cigarettes   Smokeless tobacco: Never  Substance and Sexual Activity   Alcohol use: No   Drug use: No   Sexual activity: Not Currently    Partners: Male  Other Topics Concern   Not on file  Social History Narrative   Not on file   Social Determinants of Health   Financial Resource Strain: Not on file  Food Insecurity: Food Insecurity Present (09/16/2022)   Hunger Vital Sign    Worried About Running Out of Food in the Last Year: Sometimes true    Ran Out of Food in the Last Year: Sometimes true  Transportation Needs: No Transportation Needs (09/16/2022)   PRAPARE - Administrator, Civil Service (Medical): No    Lack of Transportation (Non-Medical): No  Physical Activity: Not on file  Stress: Not on file  Social Connections: Not on file    Current Medications:  Current Outpatient Medications:    amLODipine (NORVASC) 5 MG tablet, Take 5 mg by mouth daily., Disp: , Rfl:    betamethasone dipropionate 0.05 % cream, Apply topically 2 (two) times daily., Disp: 45 g, Rfl: 2   brimonidine (ALPHAGAN) 0.2 % ophthalmic solution, Administer 1 drop to both eyes in the morning., Disp: , Rfl:    clotrimazole (LOTRIMIN) 1 % cream, Apply 1 Application topically 2 (two) times daily. Apply for 4 weeks., Disp: 60 g, Rfl: 0   dorzolamide-timolol  (COSOPT) 22.3-6.8 MG/ML ophthalmic solution, Administer 1 drop to both eyes Two (2) times a day., Disp: , Rfl:    hydrocortisone 2.5 % cream, Apply 1 Application topically 2 (two) times daily for 7 days, THEN 1 Application daily for 7 days, THEN 1 Application every other day for 7 days., Disp: 28 g, Rfl: 1   lidocaine (XYLOCAINE) 5 % ointment, Apply 1 Application topically as needed., Disp: 35.44 g, Rfl: 0   ROCKLATAN 0.02-0.005 % SOLN, Apply 1 drop to eye at bedtime., Disp: , Rfl:    tranexamic acid (LYSTEDA) 650 MG TABS tablet, Take by mouth., Disp: , Rfl:   Review of Symptoms: Complete 10-system review is negative except ***as above in Interval History.  Physical Exam: There were no vitals  taken for this visit. General: ***Alert, oriented, no acute distress. HEENT: ***Normocephalic, atraumatic. Neck symmetric without masses. Sclera anicteric.  Chest: ***Normal work of breathing. ***Clear to auscultation bilaterally.   Cardiovascular: ***Regular rate and rhythm, no murmurs. Abdomen: ***Soft, nontender.  Normoactive bowel sounds.  No masses or hepatosplenomegaly appreciated.  ***Well-healing incision***s. Extremities: ***Grossly normal range of motion.  Warm, well perfused.  No edema bilaterally. Skin: ***No rashes or lesions noted. GU: Normal appearing external genitalia without erythema, excoriation, or lesions.  Speculum exam reveals ***.  Bimanual exam reveals ***. Exam chaperoned by ***  Laboratory & Radiologic Studies: ***

## 2022-11-03 ENCOUNTER — Encounter: Payer: Self-pay | Admitting: Psychiatry

## 2022-11-03 DIAGNOSIS — E041 Nontoxic single thyroid nodule: Secondary | ICD-10-CM | POA: Insufficient documentation

## 2022-11-03 DIAGNOSIS — L280 Lichen simplex chronicus: Secondary | ICD-10-CM | POA: Insufficient documentation

## 2022-11-04 DIAGNOSIS — F17210 Nicotine dependence, cigarettes, uncomplicated: Secondary | ICD-10-CM | POA: Diagnosis not present

## 2022-11-04 DIAGNOSIS — C55 Malignant neoplasm of uterus, part unspecified: Secondary | ICD-10-CM | POA: Diagnosis not present

## 2022-11-04 DIAGNOSIS — E049 Nontoxic goiter, unspecified: Secondary | ICD-10-CM | POA: Diagnosis not present

## 2022-11-04 DIAGNOSIS — C519 Malignant neoplasm of vulva, unspecified: Secondary | ICD-10-CM | POA: Diagnosis not present

## 2022-11-04 DIAGNOSIS — I10 Essential (primary) hypertension: Secondary | ICD-10-CM | POA: Diagnosis not present

## 2022-11-04 DIAGNOSIS — Z299 Encounter for prophylactic measures, unspecified: Secondary | ICD-10-CM | POA: Diagnosis not present

## 2022-11-12 ENCOUNTER — Telehealth: Payer: Self-pay | Admitting: Genetic Counselor

## 2022-11-15 ENCOUNTER — Inpatient Hospital Stay: Payer: Medicare HMO | Attending: Psychiatry | Admitting: Psychiatry

## 2022-11-15 ENCOUNTER — Other Ambulatory Visit: Payer: Self-pay

## 2022-11-15 VITALS — BP 137/76 | HR 77 | Temp 98.3°F | Resp 14 | Ht 68.11 in | Wt 234.0 lb

## 2022-11-15 DIAGNOSIS — Z9079 Acquired absence of other genital organ(s): Secondary | ICD-10-CM | POA: Insufficient documentation

## 2022-11-15 DIAGNOSIS — Z7189 Other specified counseling: Secondary | ICD-10-CM

## 2022-11-15 DIAGNOSIS — E042 Nontoxic multinodular goiter: Secondary | ICD-10-CM | POA: Insufficient documentation

## 2022-11-15 DIAGNOSIS — D071 Carcinoma in situ of vulva: Secondary | ICD-10-CM | POA: Diagnosis not present

## 2022-11-15 DIAGNOSIS — F17210 Nicotine dependence, cigarettes, uncomplicated: Secondary | ICD-10-CM | POA: Insufficient documentation

## 2022-11-15 DIAGNOSIS — R7303 Prediabetes: Secondary | ICD-10-CM | POA: Insufficient documentation

## 2022-11-15 DIAGNOSIS — E041 Nontoxic single thyroid nodule: Secondary | ICD-10-CM

## 2022-11-15 DIAGNOSIS — B372 Candidiasis of skin and nail: Secondary | ICD-10-CM | POA: Diagnosis not present

## 2022-11-15 DIAGNOSIS — C541 Malignant neoplasm of endometrium: Secondary | ICD-10-CM | POA: Diagnosis not present

## 2022-11-15 DIAGNOSIS — C519 Malignant neoplasm of vulva, unspecified: Secondary | ICD-10-CM

## 2022-11-15 DIAGNOSIS — L280 Lichen simplex chronicus: Secondary | ICD-10-CM | POA: Diagnosis not present

## 2022-11-15 DIAGNOSIS — Z9889 Other specified postprocedural states: Secondary | ICD-10-CM | POA: Diagnosis not present

## 2022-11-15 NOTE — Progress Notes (Signed)
Gynecologic Oncology Return Clinic Visit  Date of Service: 11/15/2022 Referring Provider: Mickeal Skinner, NP [address] [place],  [place] [address]  Assessment & Plan: Anna Case is a 52 y.o. 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  Postop: -Area of separation of her vulva is healing well by secondary intention. -Continue sitz baths 1-2x daily. Genital hygiene reviewed.  - Cramping from IUD somewhat anticipated.  Patient can continue as needed meds that she is doing currently. - Ongoing postoperative expectations and precautions reviewed.   Vulvar cancer: - Surveillance recommended every 3-6 months for the first 2 years, then every 6-43mo for years 3-5. - Could consider annual pap; we will revisit.  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  Discussed with patient that we could plan for treatment of this at the time of treatment of her endometrial cancer when she is well-healed from her vulvar cancer surgery.  Endometrial cancer: - Patient to continue with IUD at this time. - Again reviewed recommendation for definitive surgical treatment with hysterectomy, BSO, lymph node assessment.  Would recommend this after she is healed from her vulvar surgery. - Patient is more amenable to considering this. - Will tentatively schedule patient for surgery but will discuss in more detail at her next follow-up visit. - Given multiple cancers as well as concerning thyroid nodules (see below), referral placed to genetics. MMRp on biopsy.  Thyroid nodules: -Patient hesitant to pursue thyroid biopsy. - Reviewed and importance of undergoing this biopsy, particularly given her findings on both ultrasound and  PET/CT. - Discussed that although patient is asymptomatic at this time, if this represents cancer and is ignored, she is more likely to become symptomatic if cancer were to grow and spread. - Patient understanding of importance and willing to undergo biopsy at this time.  Lichen simplex chronicus with fungal infection: - Prior biopsy showed changes c/w the above -Continue topical anti-fungal and low potency topic steroid cream. -Experiencing good response  Prediabetes: - A1c collected due to extensive skin changes due to fungal infection. - A1c mildly elevated to 5.7. Reviewed result with pt, consistent with pre-diabetes. She should follow-up with her PCP regarding this.  RTC 1 month  [name], MD Gynecologic Oncology   Medical Decision Making I personally spent  TOTAL 35 minutes face-to-face and non-face-to-face in the care of this patient, which includes all pre, intra, and post visit time on the date of service.   ----------------------- Reason for Visit: Postop/Treatment Counseling  Treatment History: Oncology History  Vulvar cancer (HCC)  09/20/2022 Initial Biopsy   FINAL MICROSCOPIC DIAGNOSIS:  A. VULVAR MASS, RIGHT, BIOPSY:  - Invasive well to moderately differentiated squamous cell carcinoma,  see comment  B. INTROITUS, BIOPSY:  - Low-grade squamous intraepithelial lesion (VIN1, condyloma), see  comment    09/20/2022 Initial Diagnosis   Vulvar cancer   10/13/2022 Imaging   PET: IMPRESSION: Postop changes from right vulvectomy. No evidence of residual or recurrent vulvar mass.   Intense hypermetabolic activity throughout the endometrial cavity of the uterus, highly suspicious for endometrial carcinoma.   Sub-cm bilateral inguinal lymph nodes show mild hypermetabolism. Metastatic disease cannot be excluded.   Sub-centimeter bilateral axillary lymph nodes show mild hypermetabolism, and are indeterminate.   Focal hypermetabolism in  lateral right thyroid  lobe. Thyroid carcinoma cannot be excluded. Recommend thyroid US and biopsy. (ref: J Am Coll Radiol. 2015 Feb;12(2): 143-50).   Sub-cm bilateral upper neck lymph nodes show mild hypermetabolism. Metastatic disease cannot be excluded, possibly from thyroid carcinoma if the hypermetabolic right thyroid nodule is malignant.   Endometrial cancer (HCC)  10/18/2022 Initial Diagnosis   Endometrial cancer   10/18/2022 Initial Biopsy   FINAL MICROSCOPIC DIAGNOSIS:  A. ENDOMETRIUM, BIOPSY:  - Endometrioid carcinoma, FIGO grade 1  - See comment  COMMENT:  Part A: Immunohistochemical staining for p53 and MMR is pending and will  be reported in an addendum.  Dr. Reynolds Bowl reviewed the case and agrees with  the above diagnosis.  IHC EXPRESSION RESULTS  TEST           RESULT  MLH1:          Preserved nuclear expression  MSH2:          Preserved nuclear expression  MSH6:          Preserved nuclear expression  PMS2:          Preserved nuclear expression      Interval History: Today, patient reports that she has been experiencing some ongoing cramping pelvic pain.  She is taking Tylenol and Motrin every 12 hours which seems to help.  She is performing sitz bath's and applying her antifungal cream as instructed.  She believes that the skin in her groins and thighs is improving.  She is no longer having any itching.  She notes occasional light vaginal bleeding sometimes with small clots.  No heavy bleeding.    Past Medical/Surgical History: Past Medical History:  Diagnosis Date   Glaucoma    Hypertension     Past Surgical History:  Procedure Laterality Date   COLONOSCOPY W/ POLYPECTOMY      Family History  Problem Relation Age of Onset   Diabetes Mother    Heart attack Father    Breast cancer Other        2nd cousin (maternal), female   Prostate cancer Maternal Uncle    Prostate cancer Paternal Uncle    Colon cancer Neg Hx    Ovarian cancer Neg Hx    Endometrial cancer Neg Hx    Pancreatic  cancer Neg Hx     Social History   Socioeconomic History   Marital status: Single    Spouse name: Not on file   Number of children: Not on file   Years of education: Not on file   Highest education level: Not on file  Occupational History   Not on file  Tobacco Use   Smoking status: Every Day    Packs/day: 0.07    Years: 20.00    Additional pack years: 0.00    Total pack years: 1.40    Types: Cigarettes   Smokeless tobacco: Never  Substance and Sexual Activity   Alcohol use: No   Drug use: No   Sexual activity: Not Currently    Partners: Male  Other Topics Concern   Not on file  Social History Narrative   Not on file   Social Determinants of Health   Financial Resource Strain: Not on file  Food Insecurity: Food Insecurity Present (09/16/2022)   Hunger Vital Sign    Worried About Running Out of Food in the Last Year: Sometimes true    Ran Out of Food in the Last Year: Sometimes true  Transportation Needs: No Transportation Needs (09/16/2022)  PRAPARE - Administrator, Civil Service (Medical): No    Lack of Transportation (Non-Medical): No  Physical Activity: Not on file  Stress: Not on file  Social Connections: Not on file    Current Medications:  Current Outpatient Medications:    amLODipine (NORVASC) 5 MG tablet, Take 5 mg by mouth daily., Disp: , Rfl:    betamethasone dipropionate 0.05 % cream, Apply topically 2 (two) times daily., Disp: 45 g, Rfl: 2   brimonidine (ALPHAGAN) 0.2 % ophthalmic solution, Administer 1 drop to both eyes in the morning., Disp: , Rfl:    clotrimazole (LOTRIMIN) 1 % cream, Apply 1 Application topically 2 (two) times daily. Apply for 4 weeks., Disp: 60 g, Rfl: 0   dorzolamide-timolol (COSOPT) 22.3-6.8 MG/ML ophthalmic solution, Administer 1 drop to both eyes Two (2) times a day., Disp: , Rfl:    hydrocortisone 2.5 % cream, Apply 1 Application topically 2 (two) times daily for 7 days, THEN 1 Application daily for 7 days, THEN 1  Application every other day for 7 days., Disp: 28 g, Rfl: 1   lidocaine (XYLOCAINE) 5 % ointment, Apply 1 Application topically as needed., Disp: 35.44 g, Rfl: 0   ROCKLATAN 0.02-0.005 % SOLN, Apply 1 drop to eye at bedtime., Disp: , Rfl:    tranexamic acid (LYSTEDA) 650 MG TABS tablet, Take by mouth., Disp: , Rfl:   Review of Symptoms: Complete 10-system review is positive for: Pelvic pain  Physical Exam: BP 137/76 (BP Location: Left Arm, Patient Position: Sitting)   Pulse 77   Temp 98.3 F (36.8 C)   Resp 14   Ht 5' 8.11" (1.73 m)   Wt 234 lb (106.1 kg)   SpO2 97%   BMI 35.46 kg/m  General: Alert, oriented, no acute distress. HEENT: Normocephalic, atraumatic. Sclera anicteric.  Chest: Normal work of breathing. Clear to auscultation bilaterally.   Cardiovascular: Regular rate and rhythm, no murmurs. Abdomen: Soft, nontender.  Normoactive bowel sounds.  No masses or hepatosplenomegaly appreciated.  Well-healing right inguinal incision. Glue previously removed by pt.  Extremities: Grossly normal range of motion.  Warm, well perfused.  No edema bilaterally. Skin: Skin in bilateral groins and on inner thighs significantly improved from prior still thickened in appearance but less inflamed.  Well-healing right groin incision. GU: External genitalia with evidence of right posterior radical vulvectomy with appropriate clearing of the separation in the wound bed.  Does appear to have ongoing dysplastic appearing verrucous lesion of the posterior fourchette opposite the healing wound. Exam chaperoned by Kimberly [place], CMA   Laboratory & Radiologic Studies: Diagnosis   A: Endometrium, curettage - Endometrioid adenocarcinoma, FIGO grade 1-2 with squamous differentiation (see comment) - Detached fragment of squamous epithelium with high grade squamous intraepithelial lesion (HSIL, IN2-3), favor cervical or vulvar origin - Abundant background blood   B: Vulva, right, excision - Invasive  squamous cell carcinoma, moderately differentiated, HPV associated - Carcinoma measures 2.6 cm with a depth of invasion of 0.4 cm (pT1b) - No lymphovascular space invasion identified  - Resection margins are negative for invasive carcinoma (closest approach 4 mm from 12:00) - 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 Per the prior outside biopsy report ([id number]) immunohistochemical staining for mismatch repair protein expression on that specimen showed intact expression of MLH1, PMS2, MSH2, and MSH6. p53 staining was reportedly pending on that specimen.    A detached fragment of squamous epithelium is present in the endometrial curettage that appears dysplastic and suggestive of cervical or vulvar origin (different appearance from the squamous metaplasia in the endometrioid carcinoma). p16 immunohistochemical stain is performed and is positive in this fragment. This finding supports the presence of high grade dysplasia (HSIL, IN2-3).    B: p16 immunohistochemical stains are performed on B4 and B5; the invasive carcinoma is positive for p16 in B4, consistent with HPV related squamous cell carcinoma. In B5, p16 immunohistochemical stain highlights HSIL extending to the peripheral margin.   C: The lymph node is sent for Hematopathology review given the size and reported PET activity and they feel that the  morphology is consistent with reactive lymph node (Dr. Lidia Collum).     Synoptic Report VULVA  VULVA - All Specimens  9th Edition - Protocol posted: 06/23/2022   SPECIMEN    Procedure:    Radical vulvectomy   TUMOR    Tumor Focality:    Unifocal     Tumor Site:    Right vulva     Tumor Size:    Greatest Dimension (Centimeters): 2.6 cm       Additional Dimension (Centimeters):    2 cm     Histologic Type:    Squamous cell carcinoma, HPV-associated     Histologic Grade:    G2, moderately differentiated     Depth of Invasion (Millimeters) (FIGO 2021 method):    4 mm     Tumor Growth Pattern:    Infiltrating     Other Tissue / Organ Involvement:    Not applicable     Lymphatic and / or Vascular Invasion:    Not identified   MARGINS    Margin Status for Invasive Carcinoma:    All margins negative for invasive carcinoma       Closest Margin(s) to Invasive Carcinoma:    Peripheral: 12:00       Distance from Invasive Carcinoma to Closest Margin:    4 mm     Margin Status for Precursor Lesions of Squamous Cell Carcinoma and / or Paget Disease:    High-grade squamous intraepithelial lesion (HSIL) present at margin       Margin(s) Involved by HSIL:    Peripheral: 12:00-6:00 and 6:00-12:00 peripheral margins near the 6:00 side of specimen (including 5:00 section) and 6:00 tip   REGIONAL LYMPH NODES     Regional Lymph Node Status:          :    All regional lymph nodes negative for tumor cells       Total Number of Lymph Nodes Examined:    1         Nodal Site(s) Examined:    Right inguinal       Number of Sentinel Nodes Examined:    1   pTNM CLASSIFICATION (AJCC Version 9)     Reporting of pT, pN, and (when applicable) pM categories is based on information available to the pathologist at the time the report is issued. As per the AJCC (Chapter 1,  8th Ed.) it is the managing physician's responsibility to establish the final pathologic stage based upon all pertinent information, including but  potentially not limited to this pathology report.     pT Category:    pT1b     pN Category:    pN0     N Suffix:    (sn)   FIGO STAGE     FIGO Stage:    IB   ADDITIONAL FINDINGS     Additional Findings:    High-grade squamous intraepithelial lesion / Vulvar intraepithelial neoplasia, grade 3   SPECIAL STUDIES     p16 Immunohistochemistry:    Positive      Comment(s):    See diagnosis comment

## 2022-11-15 NOTE — Patient Instructions (Addendum)
It was a pleasure to see you in clinic today. - For tylenol, you can take 500mg  every 6 hours or you can take two 325mg  tablets (which is 650mg ) up to every 6 hours.  - For ibuprofen you can take three 200mg  tablets (which equals 600mg ) every 6 hours, or four 200mg  tablets (which equals 800mg ) up to every 8 hours. - Continue sitz baths and the creams you are using. - You will come back to see me in clinic on June 10th. If everything continues to be healing well, we will have you undergo your hysterectomy on June 25th.  Thank you very much for allowing me to provide care for you today.  I appreciate your confidence in choosing our Gynecologic Oncology team at [facility].  If you have any questions about your visit today please call our office or send [place] a MyChart message and we will get back to you as soon as possible.

## 2022-11-16 ENCOUNTER — Encounter: Payer: Self-pay | Admitting: Genetic Counselor

## 2022-11-16 ENCOUNTER — Inpatient Hospital Stay (HOSPITAL_BASED_OUTPATIENT_CLINIC_OR_DEPARTMENT_OTHER): Payer: Medicare HMO | Admitting: Genetic Counselor

## 2022-11-16 ENCOUNTER — Inpatient Hospital Stay: Payer: Medicare HMO

## 2022-11-16 DIAGNOSIS — Z803 Family history of malignant neoplasm of breast: Secondary | ICD-10-CM

## 2022-11-16 DIAGNOSIS — Z8042 Family history of malignant neoplasm of prostate: Secondary | ICD-10-CM

## 2022-11-16 DIAGNOSIS — C541 Malignant neoplasm of endometrium: Secondary | ICD-10-CM | POA: Diagnosis not present

## 2022-11-16 DIAGNOSIS — Z800 Family history of malignant neoplasm of digestive organs: Secondary | ICD-10-CM

## 2022-11-16 DIAGNOSIS — C519 Malignant neoplasm of vulva, unspecified: Secondary | ICD-10-CM | POA: Diagnosis not present

## 2022-11-16 NOTE — Progress Notes (Signed)
REFERRING PROVIDER: Orlene Plum, NP [address] [place],  [place] [address]  PRIMARY PROVIDER:  Orlene Plum, NP  PRIMARY REASON FOR VISIT:  1. Endometrial cancer (HCC)   2. Vulvar cancer (HCC)   3. Family history of breast cancer   4. Family history of colon cancer   5. Family history of prostate cancer     HISTORY OF PRESENT ILLNESS:   Anna Case, a 52 y.o. female, was seen for a [place] cancer genetics consultation at the request of Dr. Mikey Bussing due to a personal and family history of cancer.  [name] presents to clinic today to discuss the possibility of a hereditary predisposition to cancer, to discuss genetic testing, and to further clarify her future cancer risks, as well as potential cancer risks for family members.   [name] was diagnosed with vulvar squamous cell carcinoma at age 66 and endometrial cancer at age 89. Endometrial cancer had normal IHC testing. She has a thyroid biopsy scheduled next week due to suspicion of thyroid cancer.   CANCER HISTORY:  Oncology History  Vulvar cancer (HCC)  09/20/2022 Initial Biopsy   FINAL MICROSCOPIC DIAGNOSIS:  A. VULVAR MASS, RIGHT, BIOPSY:  - Invasive well to moderately differentiated squamous cell carcinoma,  see comment  B. INTROITUS, BIOPSY:  - Low-grade squamous intraepithelial lesion (VIN1, condyloma), see  comment    09/20/2022 Initial Diagnosis   Vulvar cancer   10/13/2022 Imaging   PET: IMPRESSION: Postop changes from right vulvectomy. No evidence of residual or recurrent vulvar mass.   Intense hypermetabolic activity throughout the endometrial cavity of the uterus, highly suspicious for endometrial carcinoma.   Sub-cm bilateral inguinal lymph nodes show mild hypermetabolism. Metastatic disease cannot be excluded.   Sub-centimeter bilateral axillary lymph nodes show mild hypermetabolism, and are indeterminate.   Focal hypermetabolism in lateral right thyroid lobe.  Thyroid carcinoma cannot be excluded. Recommend thyroid US and biopsy. (ref: J Am Coll Radiol. 2015 Feb;12(2): 143-50).   Sub-cm bilateral upper neck lymph nodes show mild hypermetabolism. Metastatic disease cannot be excluded, possibly from thyroid carcinoma if the hypermetabolic right thyroid nodule is malignant.   Endometrial cancer (HCC)  10/18/2022 Initial Diagnosis   Endometrial cancer   10/18/2022 Initial Biopsy   FINAL MICROSCOPIC DIAGNOSIS:  A. ENDOMETRIUM, BIOPSY:  - Endometrioid carcinoma, FIGO grade 1  - See comment  COMMENT:  Part A: Immunohistochemical staining for p53 and MMR is pending and will  be reported in an addendum.  Dr. Reynolds Bowl reviewed the case and agrees with  the above diagnosis.  IHC EXPRESSION RESULTS  TEST           RESULT  MLH1:          Preserved nuclear expression  MSH2:          Preserved nuclear expression  MSH6:          Preserved nuclear expression  PMS2:          Preserved nuclear expression       RISK FACTORS:  Menarche was at age 52.  First live birth at age n/a.  OCP use for approximately 0 years.  Ovaries intact: yes.  Uterus intact: yes.  Menopausal status: perimenopausal.  HRT use: 0 years. Colonoscopy: yes;  reports 1 polyp 2 years ago and is on a 3 year interval . Mammogram within the last year: yes. Number of breast biopsies: 0. Up to date with pelvic exams: yes. Any excessive radiation exposure in the past: no  Past  Medical History:  Diagnosis Date   Glaucoma    Hypertension     Past Surgical History:  Procedure Laterality Date   COLONOSCOPY W/ POLYPECTOMY      Social History   Socioeconomic History   Marital status: Single    Spouse name: Not on file   Number of children: Not on file   Years of education: Not on file   Highest education level: Not on file  Occupational History   Not on file  Tobacco Use   Smoking status: Every Day    Packs/day: 0.07    Years: 20.00    Additional pack years: 0.00    Total  pack years: 1.40    Types: Cigarettes   Smokeless tobacco: Never  Substance and Sexual Activity   Alcohol use: No   Drug use: No   Sexual activity: Not Currently    Partners: Male  Other Topics Concern   Not on file  Social History Narrative   Not on file   Social Determinants of Health   Financial Resource Strain: Not on file  Food Insecurity: Food Insecurity Present (09/16/2022)   Hunger Vital Sign    Worried About Running Out of Food in the Last Year: Sometimes true    Ran Out of Food in the Last Year: Sometimes true  Transportation Needs: No Transportation Needs (09/16/2022)   PRAPARE - Administrator, Civil Service (Medical): No    Lack of Transportation (Non-Medical): No  Physical Activity: Not on file  Stress: Not on file  Social Connections: Not on file     FAMILY HISTORY:  We obtained a detailed, 4-generation family history.  Significant diagnoses are listed below: Family History  Problem Relation Age of Onset   Diabetes Mother    Heart attack Father    Prostate cancer Maternal Uncle 46       metastatic   Colon cancer Maternal Uncle    Prostate cancer Paternal Uncle 29   Breast cancer Other        maternal first cousin once removed   Colon cancer Other        grandmother's sister x2   Prostate cancer Other        grandmother's brother   Prostate cancer Other        grandfather's brother x2   Breast cancer Other        grandfather's sister      Ms. Greving has 6 maternal uncles. One maternal uncle was diagnosed with prostate cancer at age 58 (he died due to metastatic prostate cancer) and a second maternal uncle was diagnosed with colon cancer at an unknown age (he is living). One of Ms. Kozloski's great aunts (grandfather's sister) was diagnosed with breast cancer at an unknown age and 2 great uncles (grandfather's brothers) were diagnosed with prostate cancer, all three are deceased. One of Ms. Cannell's great uncles (grandmother's  brother) was diagnosed with prostate cancer and two great aunts (grandmother's sisters) were diagnosed with colon cancer, they are all three deceased. Ms. Negro reports one maternal first cousin once removed with diagnosed with breast cancer and she reportedly had hereditary cancer genetic testing, but Ms. Zervas does not know her results, she is deceased. There is no reported Ashkenazi Jewish ancestry.   GENETIC COUNSELING ASSESSMENT: Ms. Bynoe is a 52 y.o. female with a personal and family history of cancer which is somewhat suggestive of a hereditary predisposition to cancer. We, therefore, discussed and recommended the following at today's  visit.   DISCUSSION: We discussed that 5 - 10% of cancer is hereditary, with most cases of uterine cancer associated with Lynch Syndrome.  There are other genes that can be associated with hereditary uterine cancer syndromes.  We discussed that testing is beneficial for several reasons including knowing how to follow individuals after completing their treatment, identifying whether potential treatment options would be beneficial, and understanding if other family members could be at risk for cancer and allowing them to undergo genetic testing.   We reviewed the characteristics, features and inheritance patterns of hereditary cancer syndromes. We also discussed genetic testing, including the appropriate family members to test, the process of testing, insurance coverage and turn-around-time for results. We discussed the implications of a negative, positive, carrier and/or variant of uncertain significant result.   Based on Ms. Solanki's personal and family history of cancer, she does not meet medical criteria for genetic testing. She may have an out of pocket cost. We discussed that if her out of pocket cost for testing is over $100, the laboratory will call and confirm whether she wants to proceed with testing.  If the out of pocket cost of testing is  less than $100 she will be billed by the genetic testing laboratory.   PLAN: After considering the risks, benefits, and limitations, Ms. Neth plans to wait until she obtains Medicaid before proceeding with testing. She will contact us when she has Medicaid and proceed with testing at that time. Ms. Quarterman questions were answered to her satisfaction today. Our contact information was provided should additional questions or concerns arise. Thank you for the referral and allowing [place] to share in the care of your patient.   Lalla Brothers, MS, [facility] Genetic Counselor [email or web address] (P) [phone]  The patient was seen for a total of 35 minutes in face-to-face genetic counseling.  The patient brought her mother.  Drs. Pamelia Hoit and/or Mosetta Putt were available to discuss this case as needed.   _______________________________________________________________________ For Office Staff:  Number of people involved in session: 2 Was an Intern/ student involved with case: no

## 2022-11-17 ENCOUNTER — Other Ambulatory Visit: Payer: Self-pay | Admitting: Gynecologic Oncology

## 2022-11-17 DIAGNOSIS — C519 Malignant neoplasm of vulva, unspecified: Secondary | ICD-10-CM

## 2022-11-17 DIAGNOSIS — C541 Malignant neoplasm of endometrium: Secondary | ICD-10-CM

## 2022-11-18 ENCOUNTER — Other Ambulatory Visit: Payer: Medicare HMO

## 2022-11-24 ENCOUNTER — Other Ambulatory Visit (HOSPITAL_COMMUNITY)
Admission: RE | Admit: 2022-11-24 | Discharge: 2022-11-24 | Disposition: A | Discharge status: Home / Self Care | Payer: Medicare HMO | Source: Ambulatory Visit | Attending: Psychiatry | Admitting: Psychiatry

## 2022-11-24 ENCOUNTER — Ambulatory Visit
Admission: RE | Admit: 2022-11-24 | Discharge: 2022-11-24 | Disposition: A | Discharge status: Home / Self Care | Payer: Medicare HMO | Source: Ambulatory Visit | Attending: Psychiatry | Admitting: Psychiatry

## 2022-11-24 ENCOUNTER — Encounter: Payer: Self-pay | Admitting: Psychiatry

## 2022-11-24 DIAGNOSIS — E041 Nontoxic single thyroid nodule: Secondary | ICD-10-CM

## 2022-11-24 DIAGNOSIS — R6889 Other general symptoms and signs: Secondary | ICD-10-CM | POA: Diagnosis not present

## 2022-11-26 LAB — CYTOLOGY - NON PAP

## 2022-12-20 ENCOUNTER — Inpatient Hospital Stay (HOSPITAL_BASED_OUTPATIENT_CLINIC_OR_DEPARTMENT_OTHER): Payer: Medicare HMO | Admitting: Gynecologic Oncology

## 2022-12-20 ENCOUNTER — Other Ambulatory Visit: Payer: Self-pay

## 2022-12-20 ENCOUNTER — Inpatient Hospital Stay: Payer: Medicare HMO | Attending: Psychiatry | Admitting: Psychiatry

## 2022-12-20 VITALS — BP 137/91 | HR 90 | Temp 97.6°F | Resp 20 | Ht 68.5 in | Wt 236.8 lb

## 2022-12-20 DIAGNOSIS — Z9889 Other specified postprocedural states: Secondary | ICD-10-CM | POA: Diagnosis not present

## 2022-12-20 DIAGNOSIS — C541 Malignant neoplasm of endometrium: Secondary | ICD-10-CM

## 2022-12-20 DIAGNOSIS — Z9079 Acquired absence of other genital organ(s): Secondary | ICD-10-CM

## 2022-12-20 DIAGNOSIS — Z86718 Personal history of other venous thrombosis and embolism: Secondary | ICD-10-CM | POA: Insufficient documentation

## 2022-12-20 DIAGNOSIS — N764 Abscess of vulva: Secondary | ICD-10-CM

## 2022-12-20 DIAGNOSIS — L280 Lichen simplex chronicus: Secondary | ICD-10-CM | POA: Diagnosis not present

## 2022-12-20 DIAGNOSIS — Z7189 Other specified counseling: Secondary | ICD-10-CM | POA: Diagnosis not present

## 2022-12-20 DIAGNOSIS — D071 Carcinoma in situ of vulva: Secondary | ICD-10-CM | POA: Diagnosis not present

## 2022-12-20 DIAGNOSIS — C519 Malignant neoplasm of vulva, unspecified: Secondary | ICD-10-CM | POA: Diagnosis not present

## 2022-12-20 DIAGNOSIS — R6889 Other general symptoms and signs: Secondary | ICD-10-CM | POA: Diagnosis not present

## 2022-12-20 MED ORDER — SENNOSIDES-DOCUSATE SODIUM 8.6-50 MG PO TABS: 2.0000 | ORAL_TABLET | Freq: Every day | ORAL | 1 refills | Status: DC

## 2022-12-20 MED ORDER — IBUPROFEN 800 MG PO TABS: 800.0000 mg | ORAL_TABLET | Freq: Three times a day (TID) | ORAL | 0 refills | Status: DC | PRN

## 2022-12-20 MED ORDER — DOXYCYCLINE HYCLATE 100 MG PO TABS
100.0000 mg | ORAL_TABLET | Freq: Two times a day (BID) | ORAL | 0 refills | Status: AC
Start: 2022-12-20 — End: 2022-12-27

## 2022-12-20 MED ORDER — OXYCODONE HCL 5 MG PO TABS: 5.0000 mg | ORAL_TABLET | ORAL | 0 refills | Status: DC | PRN

## 2022-12-20 MED ORDER — CLOBETASOL PROPIONATE 0.05 % EX CREA
1.0000 | TOPICAL_CREAM | Freq: Every day | CUTANEOUS | 3 refills | Status: DC | PRN
Start: 2022-12-20 — End: 2024-03-14

## 2022-12-20 NOTE — Patient Instructions (Addendum)
Dr. Alvester Morin has ordered doxycycline that you will need to start taking now to treat the boil on your vulva. Avoid being in sunlight for long periods of time while taking this medication because it can make you burn easier.  Preparing for your Surgery  Plan for surgery on January 04, 2023 with Dr. [name] at [facility]. You will be scheduled for robotic assisted total laparoscopic hysterectomy (removal of the uterus and cervix), bilateral salpingo-oophorectomy (removal of both ovaries and fallopian tubes), sentinel lymph node biopsy, possible lymph node dissection, possible laparotomy (larger incision on your abdomen if needed), CO2 laser application to the vulva with possible vulvar biopsies.  Plan on staying overnight and getting home the next morning if you are doing well.   Pre-operative Testing -(Done on 12/22/22) You will receive a phone call from presurgical testing at [facility] to arrange for a pre-operative appointment and lab work.  -Bring your insurance card, copy of an advanced directive if applicable, medication list  -At that visit, you will be asked to sign a consent for a possible blood transfusion in case a transfusion becomes necessary during surgery.  The need for a blood transfusion is rare but having consent is a necessary part of your care.     -You should not be taking blood thinners or aspirin at least ten days prior to surgery unless instructed by your surgeon.  -Do not take supplements such as fish oil (omega 3), red yeast rice, turmeric before your surgery. You want to avoid medications with aspirin in them including headache powders such as BC or Goody's), Excedrin migraine.  Day Before Surgery at Home -You will be asked to take in a light diet the day before surgery. You will be advised you can have clear liquids up until 3 hours before your surgery.    Eat a light diet the day before surgery.  Examples including soups, broths, toast, yogurt,  mashed potatoes.  AVOID GAS PRODUCING FOODS AND BEVERAGES. Things to avoid include carbonated beverages (fizzy beverages, sodas), raw fruits and raw vegetables (uncooked), or beans.   If your bowels are filled with gas, your surgeon will have difficulty visualizing your pelvic organs which increases your surgical risks.  Your role in recovery Your role is to become active as soon as directed by your doctor, while still giving yourself time to heal.  Rest when you feel tired. You will be asked to do the following in order to speed your recovery:  - Cough and breathe deeply. This helps to clear and expand your lungs and can prevent pneumonia after surgery.  - STAY ACTIVE WHEN YOU GET HOME. Do mild physical activity. Walking or moving your legs help your circulation and body functions return to normal. Do not try to get up or walk alone the first time after surgery.   -If you develop swelling on one leg or the other, pain in the back of your leg, redness/warmth in one of your legs, please call the office or go to the Emergency Room to have a doppler to rule out a blood clot. For shortness of breath, chest pain-seek care in the Emergency Room as soon as possible. - Actively manage your pain. Managing your pain lets you move in comfort. We will ask you to rate your pain on a scale of zero to 10. It is your responsibility to tell your doctor or nurse where and how much you hurt so your pain can be treated.  Special Considerations -  If you are diabetic, you may be placed on insulin after surgery to have closer control over your blood sugars to promote healing and recovery.  This does not mean that you will be discharged on insulin.  If applicable, your oral antidiabetics will be resumed when you are tolerating a solid diet.  -Your final pathology results from surgery should be available around one week after surgery and the results will be relayed to you when available.  -Dr. Antionette Char is the  surgeon that assists your GYN Oncologist with surgery.  If you end up staying the night, the next day after your surgery you will either see Dr. Pricilla Holm, Dr. Alvester Morin, or Dr. Antionette Char.  -FMLA forms can be faxed to [phone] and please allow 5-7 business days for completion.  Pain Management After Surgery -You have been prescribed your pain medication and bowel regimen medications before surgery so that you can have these available when you are discharged from the hospital. The pain medication is for use ONLY AFTER surgery and a new prescription will not be given.   -Make sure that you have Tylenol and Ibuprofen IF YOU ARE ABLE TO TAKE THESE MEDICATIONS at home to use on a regular basis after surgery for pain control. We recommend alternating the medications every hour to six hours since they work differently and are processed in the body differently for pain relief.  -Review the attached handout on narcotic use and their risks and side effects.   Bowel Regimen -You have been prescribed Sennakot-S to take nightly to prevent constipation especially if you are taking the narcotic pain medication intermittently.  It is important to prevent constipation and drink adequate amounts of liquids. You can stop taking this medication when you are not taking pain medication and you are back on your normal bowel routine.  Risks of Surgery Risks of surgery are low but include bleeding, infection, damage to surrounding structures, re-operation, blood clots, and very rarely death.   Blood Transfusion Information (For the consent to be signed before surgery)  We will be checking your blood type before surgery so in case of emergencies, we will know what type of blood you would need.                                            WHAT IS A BLOOD TRANSFUSION?  A transfusion is the replacement of blood or some of its parts. Blood is made up of multiple cells which provide different functions. Red blood  cells carry oxygen and are used for blood loss replacement. White blood cells fight against infection. Platelets control bleeding. Plasma helps clot blood. Other blood products are available for specialized needs, such as hemophilia or other clotting disorders. BEFORE THE TRANSFUSION  Who gives blood for transfusions?  You may be able to donate blood to be used at a later date on yourself (autologous donation). Relatives can be asked to donate blood. This is generally not any safer than if you have received blood from a stranger. The same precautions are taken to ensure safety when a relative's blood is donated. Healthy volunteers who are fully evaluated to make sure their blood is safe. This is blood bank blood. Transfusion therapy is the safest it has ever been in the practice of medicine. Before blood is taken from a donor, a complete history is taken to make sure that  person has no history of diseases nor engages in risky social behavior (examples are intravenous drug use or sexual activity with multiple partners). The donor's travel history is screened to minimize risk of transmitting infections, such as malaria. The donated blood is tested for signs of infectious diseases, such as HIV and hepatitis. The blood is then tested to be sure it is compatible with you in order to minimize the chance of a transfusion reaction. If you or a relative donates blood, this is often done in anticipation of surgery and is not appropriate for emergency situations. It takes many days to process the donated blood. RISKS AND COMPLICATIONS Although transfusion therapy is very safe and saves many lives, the main dangers of transfusion include:  Getting an infectious disease. Developing a transfusion reaction. This is an allergic reaction to something in the blood you were given. Every precaution is taken to prevent this. The decision to have a blood transfusion has been considered carefully by your caregiver before  blood is given. Blood is not given unless the benefits outweigh the risks.  AFTER SURGERY INSTRUCTIONS  Return to work: 4-6 weeks if applicable  For the vulva: We recommend purchasing several bags of frozen green peas and dividing them into ziploc bags. You will want to keep these in the freezer and have them ready to use as ice packs to the vulvar incision. Once the ice pack is no longer cold, you can get another from the freezer. The frozen peas mold to your body better than a regular ice pack.   You may also be given a burn cream to use on the lasered areas on the vulva. Plan on using the peri bottle when toileting and patting the vulva dry.  Activity: 1. Be up and out of the bed during the day.  Take a nap if needed.  You may walk up steps but be careful and use the hand rail.  Stair climbing will tire you more than you think, you may need to stop part way and rest.   2. No lifting or straining for 6 weeks over 10 pounds. No pushing, pulling, straining for 6 weeks.  3. No driving for around 1 week(s).  Do not drive if you are taking narcotic pain medicine and make sure that your reaction time has returned.   4. You can shower as soon as the next day after surgery. Shower daily.  Use your regular soap and water (not directly on the incision) and pat your incision(s) dry afterwards; don't rub.  No tub baths or submerging your body in water until cleared by your surgeon. If you have the soap that was given to you by pre-surgical testing that was used before surgery, you do not need to use it afterwards because this can irritate your incisions.   5. No sexual activity and nothing in the vagina for 12 weeks.  6. You may experience a small amount of clear drainage from your incisions, which is normal.  If the drainage persists, increases, or changes color please call the office.  7. Do not use creams, lotions, or ointments such as neosporin on your incisions after surgery until advised by your  surgeon because they can cause removal of the dermabond glue on your incisions.    8. You may experience vaginal spotting after surgery or around the 6-8 week mark from surgery when the stitches at the top of the vagina begin to dissolve.  The spotting is normal but if you experience heavy  bleeding, call our office.  9. Take Tylenol or ibuprofen first for pain if you are able to take these medications and only use narcotic pain medication for severe pain not relieved by the Tylenol or Ibuprofen.  Monitor your Tylenol intake to a max of 4,000 mg in a 24 hour period. You can alternate these medications after surgery.  Diet: 1. Low sodium Heart Healthy Diet is recommended but you are cleared to resume your normal (before surgery) diet after your procedure.  2. It is safe to use a laxative, such as Miralax or Colace, if you have difficulty moving your bowels. You have been prescribed Sennakot-S to take at bedtime every evening after surgery to keep bowel movements regular and to prevent constipation.    Wound Care: 1. Keep clean and dry.  Shower daily.  Reasons to call the Doctor: Fever - Oral temperature greater than 100.4 degrees Fahrenheit Foul-smelling vaginal discharge Difficulty urinating Nausea and vomiting Increased pain at the site of the incision that is unrelieved with pain medicine. Difficulty breathing with or without chest pain New calf pain especially if only on one side Sudden, continuing increased vaginal bleeding with or without clots.   Contacts: For questions or concerns you should contact:  Dr. [name] at [phone]  Warner Mccreedy, NP at [phone]  After Hours: call [phone] and have the GYN Oncologist paged/contacted (after 5 pm or on the weekends). You will speak with an after hours RN and let he or she know you have had surgery.  Messages sent via mychart are for non-urgent matters and are not responded to after hours so for urgent needs, please  call the after hours number.     g

## 2022-12-20 NOTE — Patient Instructions (Signed)
SURGICAL WAITING ROOM VISITATION  Patients having surgery or a procedure may have no more than 2 support people in the waiting area - these visitors may rotate.    Children under the age of 48 must have an adult with them who is not the patient.  Due to an increase in RSV and influenza rates and associated hospitalizations, children ages 64 and under may not visit patients in [facility] hospitals.  If the patient needs to stay at the hospital during part of their recovery, the visitor guidelines for inpatient rooms apply. Pre-op nurse will coordinate an appropriate time for 1 support person to accompany patient in pre-op.  This support person may not rotate.    Please refer to the [facility] website for the visitor guidelines for Inpatients (after your surgery is over and you are in a regular room).    Your procedure is scheduled on: 01/04/23   Report to [facility] Main Entrance    Report to admitting at 5:15 AM   Call this number if you have problems the morning of surgery [phone]   Do not eat food :After Midnight.   After Midnight you may have the following liquids until 4:30 AM DAY OF SURGERY  Water Non-Citrus Juices (without pulp, NO RED-Apple, White grape, White cranberry) Black Coffee (NO MILK/CREAM OR CREAMERS, sugar ok)  Clear Tea (NO MILK/CREAM OR CREAMERS, sugar ok) regular and decaf                             Plain Jell-O (NO RED)                                           Fruit ices (not with fruit pulp, NO RED)                                     Popsicles (NO RED)                                                               Sports drinks like Gatorade (NO RED)          If you have questions, please contact your surgeon's office.   FOLLOW BOWEL PREP AND ANY ADDITIONAL PRE OP INSTRUCTIONS YOU RECEIVED FROM YOUR SURGEON'S OFFICE!!!     Oral Hygiene is also important to reduce your risk of infection.                                    Remember -  BRUSH YOUR TEETH THE MORNING OF SURGERY WITH YOUR REGULAR TOOTHPASTE  DENTURES WILL BE REMOVED PRIOR TO SURGERY PLEASE DO NOT APPLY "Poly grip" OR ADHESIVES!!!   Do NOT smoke after Midnight   Take these medicines the morning of surgery with A SIP OF WATER: Tylenol  DO NOT TAKE ANY ORAL DIABETIC MEDICATIONS DAY OF YOUR SURGERY  Bring CPAP mask and tubing day of surgery.  You may not have any metal on your body including hair pins, jewelry, and body piercing             Do not wear make-up, lotions, powders, perfumes, or deodorant  Do not wear nail polish including gel and S&S, artificial/acrylic nails, or any other type of covering on natural nails including finger and toenails. If you have artificial nails, gel coating, etc. that needs to be removed by a nail salon please have this removed prior to surgery or surgery may need to be canceled/ delayed if the surgeon/ anesthesia feels like they are unable to be safely monitored.   Do not shave  48 hours prior to surgery.    Do not bring valuables to the hospital. [facility] IS NOT             RESPONSIBLE   FOR VALUABLES.   Contacts, glasses, dentures or bridgework may not be worn into surgery.   Bring small overnight bag day of surgery.   DO NOT BRING YOUR HOME MEDICATIONS TO THE HOSPITAL. PHARMACY WILL DISPENSE MEDICATIONS LISTED ON YOUR MEDICATION LIST TO YOU DURING YOUR ADMISSION IN THE HOSPITAL!   Special Instructions: Bring a copy of your healthcare power of attorney and living will documents the day of surgery if you haven't scanned them before.              Please read over the following fact sheets you were given: IF YOU HAVE QUESTIONS ABOUT YOUR PRE-OP INSTRUCTIONS PLEASE CALL [phone]    If you received a COVID test during your pre-op visit  it is requested that you wear a mask when out in public, stay away from anyone that may not be feeling well and notify your surgeon if you  develop symptoms. If you test positive for Covid or have been in contact with anyone that has tested positive in the last 10 days please notify you surgeon.    [facility] - Preparing for Surgery Before surgery, you can play an important role.  Because skin is not sterile, your skin needs to be as free of germs as possible.  You can reduce the number of germs on your skin by washing with CHG (chlorahexidine gluconate) soap before surgery.  CHG is an antiseptic cleaner which kills germs and bonds with the skin to continue killing germs even after washing. Please DO NOT use if you have an allergy to CHG or antibacterial soaps.  If your skin becomes reddened/irritated stop using the CHG and inform your nurse when you arrive at Short Stay. Do not shave (including legs and underarms) for at least 48 hours prior to the first CHG shower.  You may shave your face/neck.  Please follow these instructions carefully:  1.  Shower with CHG Soap the night before surgery and the  morning of surgery.  2.  If you choose to wash your hair, wash your hair first as usual with your normal  shampoo.  3.  After you shampoo, rinse your hair and body thoroughly to remove the shampoo.                             4.  Use CHG as you would any other liquid soap.  You can apply chg directly to the skin and wash.  Gently with a scrungie or clean washcloth.  5.  Apply the CHG Soap to your body ONLY FROM THE NECK DOWN.  Do   not use on face/ open                           Wound or open sores. Avoid contact with eyes, ears mouth and   genitals (private parts).                       Wash face,  Genitals (private parts) with your normal soap.             6.  Wash thoroughly, paying special attention to the area where your    surgery  will be performed.  7.  Thoroughly rinse your body with warm water from the neck down.  8.  DO NOT shower/wash with your normal soap after using and rinsing off the CHG Soap.                9.  Pat  yourself dry with a clean towel.            10.  Wear clean pajamas.            11.  Place clean sheets on your bed the night of your first shower and do not  sleep with pets. Day of Surgery : Do not apply any lotions/deodorants the morning of surgery.  Please wear clean clothes to the hospital/surgery center.  FAILURE TO FOLLOW THESE INSTRUCTIONS MAY RESULT IN THE CANCELLATION OF YOUR SURGERY  PATIENT SIGNATURE_________________________________  NURSE SIGNATURE__________________________________  ________________________________________________________________________ WHAT IS A BLOOD TRANSFUSION? Blood Transfusion Information  A transfusion is the replacement of blood or some of its parts. Blood is made up of multiple cells which provide different functions. Red blood cells carry oxygen and are used for blood loss replacement. White blood cells fight against infection. Platelets control bleeding. Plasma helps clot blood. Other blood products are available for specialized needs, such as hemophilia or other clotting disorders. BEFORE THE TRANSFUSION  Who gives blood for transfusions?  Healthy volunteers who are fully evaluated to make sure their blood is safe. This is blood bank blood. Transfusion therapy is the safest it has ever been in the practice of medicine. Before blood is taken from a donor, a complete history is taken to make sure that person has no history of diseases nor engages in risky social behavior (examples are intravenous drug use or sexual activity with multiple partners). The donor's travel history is screened to minimize risk of transmitting infections, such as malaria. The donated blood is tested for signs of infectious diseases, such as HIV and hepatitis. The blood is then tested to be sure it is compatible with you in order to minimize the chance of a transfusion reaction. If you or a relative donates blood, this is often done in anticipation of surgery and is not  appropriate for emergency situations. It takes many days to process the donated blood. RISKS AND COMPLICATIONS Although transfusion therapy is very safe and saves many lives, the main dangers of transfusion include:  Getting an infectious disease. Developing a transfusion reaction. This is an allergic reaction to something in the blood you were given. Every precaution is taken to prevent this. The decision to have a blood transfusion has been considered carefully by your caregiver before blood is given. Blood is not given unless the benefits outweigh the risks. AFTER THE TRANSFUSION Right after receiving a blood transfusion, you will usually feel much better and more energetic. This is especially true if  your red blood cells have gotten low (anemic). The transfusion raises the level of the red blood cells which carry oxygen, and this usually causes an energy increase. The nurse administering the transfusion will monitor you carefully for complications. HOME CARE INSTRUCTIONS  No special instructions are needed after a transfusion. You may find your energy is better. Speak with your caregiver about any limitations on activity for underlying diseases you may have. SEEK MEDICAL CARE IF:  Your condition is not improving after your transfusion. You develop redness or irritation at the intravenous (IV) site. SEEK IMMEDIATE MEDICAL CARE IF:  Any of the following symptoms occur over the next 12 hours: Shaking chills. You have a temperature by mouth above 102 F (38.9 C), not controlled by medicine. Chest, back, or muscle pain. People around you feel you are not acting correctly or are confused. Shortness of breath or difficulty breathing. Dizziness and fainting. You get a rash or develop hives. You have a decrease in urine output. Your urine turns a dark color or changes to pink, red, or brown. Any of the following symptoms occur over the next 10 days: You have a temperature by mouth above 102 F  (38.9 C), not controlled by medicine. Shortness of breath. Weakness after normal activity. The white part of the eye turns yellow (jaundice). You have a decrease in the amount of urine or are urinating less often. Your urine turns a dark color or changes to pink, red, or brown. Document Released: 06/25/2000 Document Revised: 09/20/2011 Document Reviewed: 02/12/2008 [facility] Patient Information 2014 [place], [place].  _______________________________________________________________________

## 2022-12-20 NOTE — Progress Notes (Addendum)
COVID Vaccine Completed:  Date of COVID positive in last 90 days:  PCP - Sandrea Matte, NP Cardiologist -   CT- 10/13/22 in epic Chest x-ray -  EKG -  Stress Test -  ECHO -  Cardiac Cath -  Pacemaker/ICD device last checked: Spinal Cord Stimulator:  Bowel Prep -   Sleep Study -  CPAP -   Fasting Blood Sugar -  Checks Blood Sugar _____ times a day  Last dose of GLP1 agonist-  N/A GLP1 instructions:  N/A   Last dose of SGLT-2 inhibitors-  N/A SGLT-2 instructions: N/A   Blood Thinner Instructions:  Time Aspirin Instructions: Last Dose:  Activity level:  Can go up a flight of stairs and perform activities of daily living without stopping and without symptoms of chest pain or shortness of breath.  Able to exercise without symptoms  Unable to go up a flight of stairs without symptoms of     Anesthesia review:   Patient denies shortness of breath, fever, cough and chest pain at PAT appointment  Patient verbalized understanding of instructions that were given to them at the PAT appointment. Patient was also instructed that they will need to review over the PAT instructions again at home before surgery.

## 2022-12-20 NOTE — Progress Notes (Signed)
Gynecologic Oncology Return Clinic Visit  Date of Service: 12/20/2022 Referring Provider: Mickeal Skinner, NP [address] [place],  [place] [address]  Assessment & Plan: Anna Case is a 52 y.o. 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 Now with plans for OR for treatment of endometrial cancer on 01/04/23.    Endometrial cancer: - Reviewed standard of care treatment of endometrial cancer to include surgical staging with hysterectomy, BSO, lymph node evaluation. - Adjuvant treatment will depend on final pathology. - We reviewed the sentinel lymph node technique. Risks and benefits of sentinel lymph node biopsy was reviewed. We reviewed the technique and ICG dye. The patient DOES NOT have an iodine allergy or known liver dysfunction. We reviewed the false negative rate (0.4%), and that 3% of patients with metastatic disease will not have it detected by SLN biopsy in endometrial cancer. A low risk of allergic reaction to the dye, <0.2% for ICG, has been reported. We also discussed that in the case of failed mapping, which occurs 40% of the time, a bilateral or unilateral lymphadenectomy will be performed at the surgeon's discretion.  - Potential benefits of sentinel nodes including a higher detection rate for metastasis due to ultrastaging and potential reduction in operative morbidity. However, there remains uncertainty as to the role for treatment of micrometastatic disease. Further, the benefit of operative morbidity associated with the SLN technique in endometrial cancer is not yet completely known. In other patient populations (e.g. the cervical cancer population) there has been observed reductions in morbidity with SLN biopsy compared to pelvic  lymphadenectomy. Lymphedema, nerve dysfunction and lymphocysts are all potential risks with the SLN technique as with complete lymphadenectomy. Additional risks to the patient include the risk of damage to an internal organ while operating in an altered view (e.g. the black and white image of the robotic fluorescence imaging mode).  - Patient was consented for: Robotic assisted total laparoscopic hysterectomy, bilateral salpingo-oophorectomy, sentinel lymph node evaluation and biopsy, possible lymph node dissection, CO2 laser ablation of the vulva, possible vulvar biopsy on 01/04/23. - The risks of surgery were discussed in detail and she understands these to including but not limited to bleeding requiring a blood transfusion, infection, injury to adjacent organs (including but not limited to the bowels, bladder, ureters, nerves, blood vessels), thromboembolic events, wound separation, hernia, vaginal cuff separation, possible risk of lymphedema and lymphocyst if lymphadenectomy performed, unforseen complication, and possible need for re-exploration. - If the patient experiences any of these events, she understands that her hospitalization or recovery may be prolonged and that she may need to take additional medications for a prolonged period. The patient will receive DVT and antibiotic prophylaxis as indicated. She voiced a clear understanding. She had the opportunity to ask questions and informed consent was obtained today. She wishes to proceed.  - She does not require preoperative clearance. Her METs are >4.  - All preoperative instructions were reviewed. Postoperative expectations were also reviewed.  - Pt desires 1 night observation postop.   Vulvar cancer: - Well healed - Ongoing dysplasia on exam - Recommend treatment with laser at time of surgery above. - R/b/a of laser ablation reviewed included additional surgical resection or topical treatment. Pt is in agreement with proceeding with laser  ablation.  - Surveillance for cancer recommended every  3-6 months for the first 2 years, then every 6-49mo for years 3-5. - Could consider annual pap; we will revisit.   Left labial boil: - Draining, so I&D not indicated at this time. - Rx for treatment with doxycycline to help resolve prior to OR.  Thyroid nodules: - Biopsy negative for malignancy. Shows benign follicular nodule.  - TSH, fT4 with preop labs.  - Could consider endocrine referral pending results.   Lichen simplex chronicus with fungal infection: - Prior biopsy showed changes c/w the above -Continue topical anti-fungal and topical steroid cream. - Refill for clobetasol sent (pt preferred over hydrocortisone cream)  RTC postop  [name], MD Gynecologic Oncology   Medical Decision Making I personally spent  TOTAL 45 minutes face-to-face and non-face-to-face in the care of this patient, which includes all pre, intra, and post visit time on the date of service.   ----------------------- Reason for Visit: Endometrial cancer/Treatment Counseling  Treatment History: Oncology History  Vulvar cancer (HCC)  09/20/2022 Initial Biopsy   FINAL MICROSCOPIC DIAGNOSIS:  A. VULVAR MASS, RIGHT, BIOPSY:  - Invasive well to moderately differentiated squamous cell carcinoma,  see comment  B. INTROITUS, BIOPSY:  - Low-grade squamous intraepithelial lesion (VIN1, condyloma), see  comment    09/20/2022 Initial Diagnosis   Vulvar cancer   10/13/2022 Imaging   PET: IMPRESSION: Postop changes from right vulvectomy. No evidence of residual or recurrent vulvar mass.   Intense hypermetabolic activity throughout the endometrial cavity of the uterus, highly suspicious for endometrial carcinoma.   Sub-cm bilateral inguinal lymph nodes show mild hypermetabolism. Metastatic disease cannot be excluded.   Sub-centimeter bilateral axillary lymph nodes show mild hypermetabolism, and are indeterminate.   Focal hypermetabolism  in lateral right thyroid lobe. Thyroid carcinoma cannot be excluded. Recommend thyroid US and biopsy. (ref: J Am Coll Radiol. 2015 Feb;12(2): 143-50).   Sub-cm bilateral upper neck lymph nodes show mild hypermetabolism. Metastatic disease cannot be excluded, possibly from thyroid carcinoma if the hypermetabolic right thyroid nodule is malignant.   Endometrial cancer (HCC)  10/18/2022 Initial Diagnosis   Endometrial cancer   10/18/2022 Initial Biopsy   FINAL MICROSCOPIC DIAGNOSIS:  A. ENDOMETRIUM, BIOPSY:  - Endometrioid carcinoma, FIGO grade 1  - See comment  COMMENT:  Part A: Immunohistochemical staining for p53 and MMR is pending and will  be reported in an addendum.  Dr. Reynolds Bowl reviewed the case and agrees with  the above diagnosis.  IHC EXPRESSION RESULTS  TEST           RESULT  MLH1:          Preserved nuclear expression  MSH2:          Preserved nuclear expression  MSH6:          Preserved nuclear expression  PMS2:          Preserved nuclear expression      Interval History: Today, pt reports that she noticed a boil on her vulva that is recently draining. Less sore now that it is draining. She continues to use the clobetasol (worked better than hydrocortisone cream for itching in groins). Her bleeding has stopped and she is overall feeling better.     Past Medical/Surgical History: Past Medical History:  Diagnosis Date   Glaucoma    Hypertension     Past Surgical History:  Procedure Laterality Date   COLONOSCOPY W/ POLYPECTOMY      Family History  Problem Relation Age of Onset   Diabetes Mother    Heart  attack Father    Prostate cancer Maternal Uncle 42       metastatic   Colon cancer Maternal Uncle    Prostate cancer Paternal Uncle 38   Breast cancer Other        maternal first cousin once removed   Colon cancer Other        grandmother's sister x2   Prostate cancer Other        grandmother's brother   Prostate cancer Other        grandfather's brother x2    Breast cancer Other        grandfather's sister   Ovarian cancer Neg Hx    Endometrial cancer Neg Hx    Pancreatic cancer Neg Hx     Social History   Socioeconomic History   Marital status: Single    Spouse name: Not on file   Number of children: Not on file   Years of education: Not on file   Highest education level: Not on file  Occupational History   Not on file  Tobacco Use   Smoking status: Every Day    Packs/day: 0.07    Years: 20.00    Additional pack years: 0.00    Total pack years: 1.40    Types: Cigarettes   Smokeless tobacco: Never  Substance and Sexual Activity   Alcohol use: No   Drug use: No   Sexual activity: Not Currently    Partners: Male  Other Topics Concern   Not on file  Social History Narrative   Not on file   Social Determinants of Health   Financial Resource Strain: Not on file  Food Insecurity: Food Insecurity Present (09/16/2022)   Hunger Vital Sign    Worried About Running Out of Food in the Last Year: Sometimes true    Ran Out of Food in the Last Year: Sometimes true  Transportation Needs: No Transportation Needs (09/16/2022)   PRAPARE - Administrator, Civil Service (Medical): No    Lack of Transportation (Non-Medical): No  Physical Activity: Not on file  Stress: Not on file  Social Connections: Not on file    Current Medications:  Current Outpatient Medications:    acetaminophen (TYLENOL) 500 MG tablet, Take 500 mg by mouth every 8 (eight) hours as needed for moderate pain., Disp: , Rfl:    amLODipine-valsartan (EXFORGE) 10-320 MG tablet, Take 1 tablet by mouth daily., Disp: , Rfl:    ascorbic acid (VITAMIN C) 500 MG tablet, Take 500 mg by mouth daily., Disp: , Rfl:    brimonidine (ALPHAGAN) 0.2 % ophthalmic solution, Place 1 drop into both eyes in the morning and at bedtime., Disp: , Rfl:    clotrimazole (LOTRIMIN) 1 % cream, Apply 1 Application topically 2 (two) times daily. Apply for 4 weeks., Disp: 60 g, Rfl: 0    dorzolamide-timolol (COSOPT) 22.3-6.8 MG/ML ophthalmic solution, Place 1 drop into both eyes 2 (two) times daily., Disp: , Rfl:    ferrous sulfate 325 (65 FE) MG tablet, Take 325 mg by mouth daily with breakfast., Disp: , Rfl:    ibuprofen (ADVIL) 200 MG tablet, Take 600 mg by mouth every 8 (eight) hours as needed for moderate pain., Disp: , Rfl:    ROCKLATAN 0.02-0.005 % SOLN, Place 1 drop into both eyes at bedtime., Disp: , Rfl:   Review of Symptoms: Complete 10-system review is positive for: Palpitations tooth, urinary frequency, vaginal bleeding, rash, wheezing, bloating, vision problems, hot flashes, itch  Physical  Exam: BP (!) 137/91 (BP Location: Left Arm, Patient Position: Sitting)   Pulse 90   Temp 97.6 F (36.4 C)   Resp 20   Ht 5' 8.5" (1.74 m)   Wt 236 lb 12.8 oz (107.4 kg)   SpO2 100%   BMI 35.48 kg/m  General: Alert, oriented, no acute distress. HEENT: Normocephalic, atraumatic. Sclera anicteric.  Chest: Normal work of breathing. Clear to auscultation bilaterally.   Cardiovascular: Regular rate and rhythm, no murmurs. Abdomen: Soft, nontender.  Well-healed right inguinal incision.  Extremities: Grossly normal range of motion.  Warm, well perfused.  No edema bilaterally. Skin: skin under pannus and in bilateral groins with thickening and darkening, with central pink and white moist tissue, slightly worse than prior. Well healed right groin incision.  GU: External genitalia with evidence of right posterior radical vulvectomy, well healed.  Some ongoing dysplastic appearing verrucous lesion of the posterior fourchette opposite the healing wound. Left posterior labia majora with 1-1.5cm nodule with pinpoint opening with drainage. Exam chaperoned by Terald Sleeper, LPN   Laboratory & Radiologic Studies: Surgical path, thyroid biopsy (11/24/22): FINAL MICROSCOPIC DIAGNOSIS:  - Consistent with benign follicular nodule (Bethesda category II)

## 2022-12-20 NOTE — H&P (View-Only) (Signed)
Gynecologic Oncology Return Clinic Visit  Date of Service: 12/20/2022 Referring Provider: Mickeal Skinner, NP [address] [place],  [place] [address]  Assessment & Plan: Anna Case is a 52 y.o. 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 Now with plans for OR for treatment of endometrial cancer on 01/04/23.    Endometrial cancer: - Reviewed standard of care treatment of endometrial cancer to include surgical staging with hysterectomy, BSO, lymph node evaluation. - Adjuvant treatment will depend on final pathology. - We reviewed the sentinel lymph node technique. Risks and benefits of sentinel lymph node biopsy was reviewed. We reviewed the technique and ICG dye. The patient DOES NOT have an iodine allergy or known liver dysfunction. We reviewed the false negative rate (0.4%), and that 3% of patients with metastatic disease will not have it detected by SLN biopsy in endometrial cancer. A low risk of allergic reaction to the dye, <0.2% for ICG, has been reported. We also discussed that in the case of failed mapping, which occurs 40% of the time, a bilateral or unilateral lymphadenectomy will be performed at the surgeon's discretion.  - Potential benefits of sentinel nodes including a higher detection rate for metastasis due to ultrastaging and potential reduction in operative morbidity. However, there remains uncertainty as to the role for treatment of micrometastatic disease. Further, the benefit of operative morbidity associated with the SLN technique in endometrial cancer is not yet completely known. In other patient populations (e.g. the cervical cancer population) there has been observed reductions in morbidity with SLN biopsy compared to pelvic  lymphadenectomy. Lymphedema, nerve dysfunction and lymphocysts are all potential risks with the SLN technique as with complete lymphadenectomy. Additional risks to the patient include the risk of damage to an internal organ while operating in an altered view (e.g. the black and white image of the robotic fluorescence imaging mode).  - Patient was consented for: Robotic assisted total laparoscopic hysterectomy, bilateral salpingo-oophorectomy, sentinel lymph node evaluation and biopsy, possible lymph node dissection, CO2 laser ablation of the vulva, possible vulvar biopsy on 01/04/23. - The risks of surgery were discussed in detail and she understands these to including but not limited to bleeding requiring a blood transfusion, infection, injury to adjacent organs (including but not limited to the bowels, bladder, ureters, nerves, blood vessels), thromboembolic events, wound separation, hernia, vaginal cuff separation, possible risk of lymphedema and lymphocyst if lymphadenectomy performed, unforseen complication, and possible need for re-exploration. - If the patient experiences any of these events, she understands that her hospitalization or recovery may be prolonged and that she may need to take additional medications for a prolonged period. The patient will receive DVT and antibiotic prophylaxis as indicated. She voiced a clear understanding. She had the opportunity to ask questions and informed consent was obtained today. She wishes to proceed.  - She does not require preoperative clearance. Her METs are >4.  - All preoperative instructions were reviewed. Postoperative expectations were also reviewed.  - Pt desires 1 night observation postop.   Vulvar cancer: - Well healed - Ongoing dysplasia on exam - Recommend treatment with laser at time of surgery above. - R/b/a of laser ablation reviewed included additional surgical resection or topical treatment. Pt is in agreement with proceeding with laser  ablation.  - Surveillance for cancer recommended every  3-6 months for the first 2 years, then every 6-49mo for years 3-5. - Could consider annual pap; we will revisit.   Left labial boil: - Draining, so I&D not indicated at this time. - Rx for treatment with doxycycline to help resolve prior to OR.  Thyroid nodules: - Biopsy negative for malignancy. Shows benign follicular nodule.  - TSH, fT4 with preop labs.  - Could consider endocrine referral pending results.   Lichen simplex chronicus with fungal infection: - Prior biopsy showed changes c/w the above -Continue topical anti-fungal and topical steroid cream. - Refill for clobetasol sent (pt preferred over hydrocortisone cream)  RTC postop  [name], MD Gynecologic Oncology   Medical Decision Making I personally spent  TOTAL 45 minutes face-to-face and non-face-to-face in the care of this patient, which includes all pre, intra, and post visit time on the date of service.   ----------------------- Reason for Visit: Endometrial cancer/Treatment Counseling  Treatment History: Oncology History  Vulvar cancer (HCC)  09/20/2022 Initial Biopsy   FINAL MICROSCOPIC DIAGNOSIS:  A. VULVAR MASS, RIGHT, BIOPSY:  - Invasive well to moderately differentiated squamous cell carcinoma,  see comment  B. INTROITUS, BIOPSY:  - Low-grade squamous intraepithelial lesion (VIN1, condyloma), see  comment    09/20/2022 Initial Diagnosis   Vulvar cancer   10/13/2022 Imaging   PET: IMPRESSION: Postop changes from right vulvectomy. No evidence of residual or recurrent vulvar mass.   Intense hypermetabolic activity throughout the endometrial cavity of the uterus, highly suspicious for endometrial carcinoma.   Sub-cm bilateral inguinal lymph nodes show mild hypermetabolism. Metastatic disease cannot be excluded.   Sub-centimeter bilateral axillary lymph nodes show mild hypermetabolism, and are indeterminate.   Focal hypermetabolism  in lateral right thyroid lobe. Thyroid carcinoma cannot be excluded. Recommend thyroid US and biopsy. (ref: J Am Coll Radiol. 2015 Feb;12(2): 143-50).   Sub-cm bilateral upper neck lymph nodes show mild hypermetabolism. Metastatic disease cannot be excluded, possibly from thyroid carcinoma if the hypermetabolic right thyroid nodule is malignant.   Endometrial cancer (HCC)  10/18/2022 Initial Diagnosis   Endometrial cancer   10/18/2022 Initial Biopsy   FINAL MICROSCOPIC DIAGNOSIS:  A. ENDOMETRIUM, BIOPSY:  - Endometrioid carcinoma, FIGO grade 1  - See comment  COMMENT:  Part A: Immunohistochemical staining for p53 and MMR is pending and will  be reported in an addendum.  Dr. Reynolds Bowl reviewed the case and agrees with  the above diagnosis.  IHC EXPRESSION RESULTS  TEST           RESULT  MLH1:          Preserved nuclear expression  MSH2:          Preserved nuclear expression  MSH6:          Preserved nuclear expression  PMS2:          Preserved nuclear expression      Interval History: Today, pt reports that she noticed a boil on her vulva that is recently draining. Less sore now that it is draining. She continues to use the clobetasol (worked better than hydrocortisone cream for itching in groins). Her bleeding has stopped and she is overall feeling better.     Past Medical/Surgical History: Past Medical History:  Diagnosis Date   Glaucoma    Hypertension     Past Surgical History:  Procedure Laterality Date   COLONOSCOPY W/ POLYPECTOMY      Family History  Problem Relation Age of Onset   Diabetes Mother    Heart  attack Father    Prostate cancer Maternal Uncle 42       metastatic   Colon cancer Maternal Uncle    Prostate cancer Paternal Uncle 38   Breast cancer Other        maternal first cousin once removed   Colon cancer Other        grandmother's sister x2   Prostate cancer Other        grandmother's brother   Prostate cancer Other        grandfather's brother x2    Breast cancer Other        grandfather's sister   Ovarian cancer Neg Hx    Endometrial cancer Neg Hx    Pancreatic cancer Neg Hx     Social History   Socioeconomic History   Marital status: Single    Spouse name: Not on file   Number of children: Not on file   Years of education: Not on file   Highest education level: Not on file  Occupational History   Not on file  Tobacco Use   Smoking status: Every Day    Packs/day: 0.07    Years: 20.00    Additional pack years: 0.00    Total pack years: 1.40    Types: Cigarettes   Smokeless tobacco: Never  Substance and Sexual Activity   Alcohol use: No   Drug use: No   Sexual activity: Not Currently    Partners: Male  Other Topics Concern   Not on file  Social History Narrative   Not on file   Social Determinants of Health   Financial Resource Strain: Not on file  Food Insecurity: Food Insecurity Present (09/16/2022)   Hunger Vital Sign    Worried About Running Out of Food in the Last Year: Sometimes true    Ran Out of Food in the Last Year: Sometimes true  Transportation Needs: No Transportation Needs (09/16/2022)   PRAPARE - Administrator, Civil Service (Medical): No    Lack of Transportation (Non-Medical): No  Physical Activity: Not on file  Stress: Not on file  Social Connections: Not on file    Current Medications:  Current Outpatient Medications:    acetaminophen (TYLENOL) 500 MG tablet, Take 500 mg by mouth every 8 (eight) hours as needed for moderate pain., Disp: , Rfl:    amLODipine-valsartan (EXFORGE) 10-320 MG tablet, Take 1 tablet by mouth daily., Disp: , Rfl:    ascorbic acid (VITAMIN C) 500 MG tablet, Take 500 mg by mouth daily., Disp: , Rfl:    brimonidine (ALPHAGAN) 0.2 % ophthalmic solution, Place 1 drop into both eyes in the morning and at bedtime., Disp: , Rfl:    clotrimazole (LOTRIMIN) 1 % cream, Apply 1 Application topically 2 (two) times daily. Apply for 4 weeks., Disp: 60 g, Rfl: 0    dorzolamide-timolol (COSOPT) 22.3-6.8 MG/ML ophthalmic solution, Place 1 drop into both eyes 2 (two) times daily., Disp: , Rfl:    ferrous sulfate 325 (65 FE) MG tablet, Take 325 mg by mouth daily with breakfast., Disp: , Rfl:    ibuprofen (ADVIL) 200 MG tablet, Take 600 mg by mouth every 8 (eight) hours as needed for moderate pain., Disp: , Rfl:    ROCKLATAN 0.02-0.005 % SOLN, Place 1 drop into both eyes at bedtime., Disp: , Rfl:   Review of Symptoms: Complete 10-system review is positive for: Palpitations tooth, urinary frequency, vaginal bleeding, rash, wheezing, bloating, vision problems, hot flashes, itch  Physical  Exam: BP (!) 137/91 (BP Location: Left Arm, Patient Position: Sitting)   Pulse 90   Temp 97.6 F (36.4 C)   Resp 20   Ht 5' 8.5" (1.74 m)   Wt 236 lb 12.8 oz (107.4 kg)   SpO2 100%   BMI 35.48 kg/m  General: Alert, oriented, no acute distress. HEENT: Normocephalic, atraumatic. Sclera anicteric.  Chest: Normal work of breathing. Clear to auscultation bilaterally.   Cardiovascular: Regular rate and rhythm, no murmurs. Abdomen: Soft, nontender.  Well-healed right inguinal incision.  Extremities: Grossly normal range of motion.  Warm, well perfused.  No edema bilaterally. Skin: skin under pannus and in bilateral groins with thickening and darkening, with central pink and white moist tissue, slightly worse than prior. Well healed right groin incision.  GU: External genitalia with evidence of right posterior radical vulvectomy, well healed.  Some ongoing dysplastic appearing verrucous lesion of the posterior fourchette opposite the healing wound. Left posterior labia majora with 1-1.5cm nodule with pinpoint opening with drainage. Exam chaperoned by Terald Sleeper, LPN   Laboratory & Radiologic Studies: Surgical path, thyroid biopsy (11/24/22): FINAL MICROSCOPIC DIAGNOSIS:  - Consistent with benign follicular nodule (Bethesda category II)

## 2022-12-21 ENCOUNTER — Encounter: Payer: Medicare HMO | Admitting: Genetic Counselor

## 2022-12-21 ENCOUNTER — Other Ambulatory Visit: Payer: Medicare HMO

## 2022-12-22 ENCOUNTER — Encounter (HOSPITAL_COMMUNITY)
Admission: RE | Admit: 2022-12-22 | Discharge: 2022-12-22 | Disposition: A | Discharge status: Home / Self Care | Payer: Medicare HMO | Source: Ambulatory Visit | Attending: Psychiatry | Admitting: Psychiatry

## 2022-12-22 ENCOUNTER — Encounter (HOSPITAL_COMMUNITY): Payer: Self-pay

## 2022-12-22 ENCOUNTER — Other Ambulatory Visit: Payer: Self-pay

## 2022-12-22 VITALS — BP 142/93 | HR 73 | Temp 97.9°F | Resp 14 | Ht 68.5 in | Wt 235.0 lb

## 2022-12-22 DIAGNOSIS — C541 Malignant neoplasm of endometrium: Secondary | ICD-10-CM | POA: Diagnosis not present

## 2022-12-22 DIAGNOSIS — D071 Carcinoma in situ of vulva: Secondary | ICD-10-CM | POA: Insufficient documentation

## 2022-12-22 DIAGNOSIS — E041 Nontoxic single thyroid nodule: Secondary | ICD-10-CM | POA: Insufficient documentation

## 2022-12-22 DIAGNOSIS — I10 Essential (primary) hypertension: Secondary | ICD-10-CM | POA: Diagnosis not present

## 2022-12-22 DIAGNOSIS — Z01818 Encounter for other preprocedural examination: Secondary | ICD-10-CM | POA: Insufficient documentation

## 2022-12-22 DIAGNOSIS — C519 Malignant neoplasm of vulva, unspecified: Secondary | ICD-10-CM | POA: Diagnosis not present

## 2022-12-22 HISTORY — DX: Prediabetes: R73.03

## 2022-12-22 HISTORY — DX: Anemia, unspecified: D64.9

## 2022-12-22 HISTORY — DX: Malignant (primary) neoplasm, unspecified: C80.1

## 2022-12-22 HISTORY — DX: Gastro-esophageal reflux disease without esophagitis: K21.9

## 2022-12-22 LAB — COMPREHENSIVE METABOLIC PANEL
ALT: 19 U/L (ref 0–44)
AST: 14 U/L — ABNORMAL LOW (ref 15–41)
Albumin: 3.9 g/dL (ref 3.5–5.0)
Alkaline Phosphatase: 67 U/L (ref 38–126)
Anion gap: 8 (ref 5–15)
BUN: 13 mg/dL (ref 6–20)
CO2: 23 mmol/L (ref 22–32)
Calcium: 9.1 mg/dL (ref 8.9–10.3)
Chloride: 105 mmol/L (ref 98–111)
Creatinine, Ser: 0.77 mg/dL (ref 0.44–1.00)
GFR, Estimated: >60 mL/min (ref >60)
Glucose, Bld: 151 mg/dL — ABNORMAL HIGH (ref 70–99)
Potassium: 3.9 mmol/L (ref 3.5–5.1)
Sodium: 136 mmol/L (ref 135–145)
Total Bilirubin: 0.4 mg/dL (ref 0.3–1.2)
Total Protein: 7.8 g/dL (ref 6.5–8.1)

## 2022-12-22 LAB — CBC
HCT: 43.4 % (ref 36.0–46.0)
Hemoglobin: 13.6 g/dL (ref 12.0–15.0)
MCH: 26.9 pg (ref 26.0–34.0)
MCHC: 31.3 g/dL (ref 30.0–36.0)
MCV: 85.8 fL (ref 80.0–100.0)
Platelets: 344 10*3/uL (ref 150–400)
RBC: 5.06 MIL/uL (ref 3.87–5.11)
RDW: 14.8 % (ref 11.5–15.5)
WBC: 5.5 10*3/uL (ref 4.0–10.5)
nRBC: 0 % (ref 0.0–0.2)

## 2022-12-22 LAB — T4, FREE: Free T4: 0.76 ng/dL (ref 0.61–1.12)

## 2022-12-22 LAB — TYPE AND SCREEN
ABO/RH(D): A POS
Antibody Screen: NEGATIVE

## 2022-12-22 LAB — TSH: TSH: 0.893 u[IU]/mL (ref 0.350–4.500)

## 2022-12-23 NOTE — Progress Notes (Signed)
Patient here for follow up with Dr. Alvester Morin and for a pre-operative appointment prior to her scheduled surgery on January 04, 2023. She is scheduled for robotic assisted total laparoscopic hysterectomy, bilateral salpingo-oophorectomy, sentinel lymph node biopsy, possible lymph node dissection, possible laparotomy, CO2 laser application to the vulva with possible vulvar biopsies. The surgery was discussed in detail.  See after visit summary for additional details. Visual aids used to discuss items related to surgery.   Discussed post-op pain management in detail including the aspects of the enhanced recovery pathway.  Advised her that a new prescription would be sent in for oxycodone and it is only to be used for after her upcoming surgery.  We discussed the use of tylenol post-op and to monitor for a maximum of 4,000 mg in a 24 hour period.  Also prescribed sennakot to be used after surgery and to hold if having loose stools.  Discussed bowel regimen in detail.     Discussed the use of SCDs and measures to take at home to prevent DVT including frequent mobility.  Reportable signs and symptoms of DVT discussed. Post-operative instructions discussed and expectations for after surgery. Incisional care discussed as well including reportable signs and symptoms including erythema, drainage, wound separation.     10 minutes spent with the patient/preparing information.  Verbalizing understanding of material discussed. No needs or concerns voiced at the end of the visit.   Advised patient to call for any needs.  Advised that her post-operative medications had been prescribed and could be picked up at any time.    This appointment is included in the global surgical bundle as pre-operative teaching and has no charge.

## 2022-12-31 DIAGNOSIS — F17211 Nicotine dependence, cigarettes, in remission: Secondary | ICD-10-CM | POA: Diagnosis not present

## 2022-12-31 DIAGNOSIS — Z1339 Encounter for screening examination for other mental health and behavioral disorders: Secondary | ICD-10-CM | POA: Diagnosis not present

## 2022-12-31 DIAGNOSIS — Z1331 Encounter for screening for depression: Secondary | ICD-10-CM | POA: Diagnosis not present

## 2022-12-31 DIAGNOSIS — Z713 Dietary counseling and surveillance: Secondary | ICD-10-CM | POA: Diagnosis not present

## 2022-12-31 DIAGNOSIS — Z7189 Other specified counseling: Secondary | ICD-10-CM | POA: Diagnosis not present

## 2022-12-31 DIAGNOSIS — Z0000 Encounter for general adult medical examination without abnormal findings: Secondary | ICD-10-CM | POA: Diagnosis not present

## 2022-12-31 DIAGNOSIS — I10 Essential (primary) hypertension: Secondary | ICD-10-CM | POA: Diagnosis not present

## 2022-12-31 DIAGNOSIS — F17210 Nicotine dependence, cigarettes, uncomplicated: Secondary | ICD-10-CM | POA: Diagnosis not present

## 2022-12-31 DIAGNOSIS — Z1389 Encounter for screening for other disorder: Secondary | ICD-10-CM | POA: Diagnosis not present

## 2022-12-31 DIAGNOSIS — Z299 Encounter for prophylactic measures, unspecified: Secondary | ICD-10-CM | POA: Diagnosis not present

## 2022-12-31 DIAGNOSIS — Z6837 Body mass index (BMI) 37.0-37.9, adult: Secondary | ICD-10-CM | POA: Diagnosis not present

## 2023-01-02 ENCOUNTER — Encounter: Payer: Self-pay | Admitting: Psychiatry

## 2023-01-03 ENCOUNTER — Telehealth: Payer: Self-pay | Admitting: Surgery

## 2023-01-03 NOTE — Telephone Encounter (Signed)
Attempted to reach patient to check in with her pre-operatively. Unable to reach patient. Left message requesting return call.   ?

## 2023-01-03 NOTE — Telephone Encounter (Signed)
Telephone call to check on pre-operative status.  Patient compliant with pre-operative instructions.  Reinforced nothing to eat after midnight. Clear liquids until 5:45am. Patient to arrive at 6:45am. Verified that post-op medications have been sent to patient's preferred pharmacy.  No questions or concerns voiced.  Instructed to call for any needs. 

## 2023-01-04 ENCOUNTER — Ambulatory Visit (HOSPITAL_COMMUNITY): Payer: Medicare HMO | Admitting: Certified Registered Nurse Anesthetist

## 2023-01-04 ENCOUNTER — Ambulatory Visit (HOSPITAL_BASED_OUTPATIENT_CLINIC_OR_DEPARTMENT_OTHER): Payer: Medicare HMO | Admitting: Certified Registered Nurse Anesthetist

## 2023-01-04 ENCOUNTER — Encounter (HOSPITAL_COMMUNITY)
Admission: RE | Disposition: A | Discharge status: Home / Self Care | Payer: Self-pay | Source: Ambulatory Visit | Attending: Psychiatry

## 2023-01-04 ENCOUNTER — Ambulatory Visit (HOSPITAL_COMMUNITY)
Admission: RE | Admit: 2023-01-04 | Discharge: 2023-01-04 | Disposition: A | Discharge status: Home / Self Care | Payer: Medicare HMO | Source: Ambulatory Visit | Attending: Obstetrics & Gynecology | Admitting: Obstetrics & Gynecology

## 2023-01-04 ENCOUNTER — Other Ambulatory Visit: Payer: Self-pay

## 2023-01-04 ENCOUNTER — Encounter (HOSPITAL_COMMUNITY): Payer: Self-pay | Admitting: Psychiatry

## 2023-01-04 DIAGNOSIS — F17210 Nicotine dependence, cigarettes, uncomplicated: Secondary | ICD-10-CM

## 2023-01-04 DIAGNOSIS — N903 Dysplasia of vulva, unspecified: Secondary | ICD-10-CM

## 2023-01-04 DIAGNOSIS — E042 Nontoxic multinodular goiter: Secondary | ICD-10-CM | POA: Diagnosis not present

## 2023-01-04 DIAGNOSIS — Z08 Encounter for follow-up examination after completed treatment for malignant neoplasm: Secondary | ICD-10-CM | POA: Diagnosis not present

## 2023-01-04 DIAGNOSIS — C519 Malignant neoplasm of vulva, unspecified: Secondary | ICD-10-CM | POA: Diagnosis not present

## 2023-01-04 DIAGNOSIS — D630 Anemia in neoplastic disease: Secondary | ICD-10-CM | POA: Diagnosis not present

## 2023-01-04 DIAGNOSIS — N736 Female pelvic peritoneal adhesions (postinfective): Secondary | ICD-10-CM | POA: Diagnosis not present

## 2023-01-04 DIAGNOSIS — Z8544 Personal history of malignant neoplasm of other female genital organs: Secondary | ICD-10-CM | POA: Insufficient documentation

## 2023-01-04 DIAGNOSIS — K660 Peritoneal adhesions (postprocedural) (postinfection): Secondary | ICD-10-CM | POA: Insufficient documentation

## 2023-01-04 DIAGNOSIS — Z975 Presence of (intrauterine) contraceptive device: Secondary | ICD-10-CM | POA: Diagnosis not present

## 2023-01-04 DIAGNOSIS — E041 Nontoxic single thyroid nodule: Secondary | ICD-10-CM

## 2023-01-04 DIAGNOSIS — C541 Malignant neoplasm of endometrium: Secondary | ICD-10-CM | POA: Diagnosis present

## 2023-01-04 DIAGNOSIS — I10 Essential (primary) hypertension: Secondary | ICD-10-CM | POA: Diagnosis not present

## 2023-01-04 DIAGNOSIS — Z79899 Other long term (current) drug therapy: Secondary | ICD-10-CM | POA: Insufficient documentation

## 2023-01-04 DIAGNOSIS — D071 Carcinoma in situ of vulva: Secondary | ICD-10-CM

## 2023-01-04 DIAGNOSIS — Z8542 Personal history of malignant neoplasm of other parts of uterus: Secondary | ICD-10-CM | POA: Diagnosis not present

## 2023-01-04 HISTORY — PX: SENTINEL NODE BIOPSY: SHX6608

## 2023-01-04 HISTORY — PX: CO2 LASER APPLICATION: SHX5778

## 2023-01-04 HISTORY — PX: ROBOTIC ASSISTED TOTAL HYSTERECTOMY WITH BILATERAL SALPINGO OOPHERECTOMY: SHX6086

## 2023-01-04 LAB — ABO/RH: ABO/RH(D): A POS

## 2023-01-04 LAB — GLUCOSE, CAPILLARY: Glucose-Capillary: 130 mg/dL — ABNORMAL HIGH (ref 70–99)

## 2023-01-04 LAB — POCT PREGNANCY, URINE: Preg Test, Ur: NEGATIVE

## 2023-01-04 SURGERY — HYSTERECTOMY, TOTAL, ROBOT-ASSISTED, LAPAROSCOPIC, WITH BILATERAL SALPINGO-OOPHORECTOMY
Anesthesia: General

## 2023-01-04 MED ORDER — FENTANYL CITRATE PF 50 MCG/ML IJ SOSY
25.0000 ug | PREFILLED_SYRINGE | INTRAMUSCULAR | Status: DC | PRN
  Administered 2023-01-04 (×2): 50 ug via INTRAVENOUS

## 2023-01-04 MED ORDER — CHLORHEXIDINE GLUCONATE 0.12 % MT SOLN
15.0000 mL | Freq: Once | OROMUCOSAL | Status: AC
  Administered 2023-01-04: 15 mL via OROMUCOSAL

## 2023-01-04 MED ORDER — SILVER SULFADIAZINE 1 % EX CREA
TOPICAL_CREAM | CUTANEOUS | Status: DC | PRN
  Administered 2023-01-04: 1 via TOPICAL

## 2023-01-04 MED ORDER — SCOPOLAMINE 1 MG/3DAYS TD PT72
1.0000 | MEDICATED_PATCH | TRANSDERMAL | Status: DC
  Administered 2023-01-04: 1.5 mg via TRANSDERMAL
  Filled 2023-01-04: qty 1

## 2023-01-04 MED ORDER — NETARSUDIL-LATANOPROST 0.02-0.005 % OP SOLN: 1.0000 [drp] | Freq: Every day | OPHTHALMIC | Status: DC

## 2023-01-04 MED ORDER — BUPIVACAINE HCL 0.25 % IJ SOLN
INTRAMUSCULAR | Status: DC | PRN
  Administered 2023-01-04: 10 mL
  Administered 2023-01-04: 20 mL

## 2023-01-04 MED ORDER — SILVER NITRATE-POT NITRATE 75-25 % EX MISC
CUTANEOUS | Status: DC | PRN
  Administered 2023-01-04: 3

## 2023-01-04 MED ORDER — PHENYLEPHRINE HCL-NACL 20-0.9 MG/250ML-% IV SOLN
INTRAVENOUS | Status: DC | PRN
  Administered 2023-01-04: 20 ug/min via INTRAVENOUS

## 2023-01-04 MED ORDER — HYDROMORPHONE HCL 1 MG/ML IJ SOLN: 0.5000 mg | INTRAMUSCULAR | Status: DC | PRN

## 2023-01-04 MED ORDER — STERILE WATER FOR INJECTION IJ SOLN
INTRAMUSCULAR | Status: AC
  Filled 2023-01-04: qty 10

## 2023-01-04 MED ORDER — DEXAMETHASONE SODIUM PHOSPHATE 4 MG/ML IJ SOLN: 4.0000 mg | INTRAMUSCULAR | Status: DC

## 2023-01-04 MED ORDER — FENTANYL CITRATE (PF) 250 MCG/5ML IJ SOLN
INTRAMUSCULAR | Status: AC
  Filled 2023-01-04: qty 5

## 2023-01-04 MED ORDER — CEFAZOLIN SODIUM-DEXTROSE 2-4 GM/100ML-% IV SOLN
2.0000 g | INTRAVENOUS | Status: AC
  Administered 2023-01-04: 2 g via INTRAVENOUS
  Filled 2023-01-04: qty 100

## 2023-01-04 MED ORDER — STERILE WATER FOR IRRIGATION IR SOLN
Status: DC | PRN
  Administered 2023-01-04: 1000 mL

## 2023-01-04 MED ORDER — MIDAZOLAM HCL 2 MG/2ML IJ SOLN
INTRAMUSCULAR | Status: DC | PRN
  Administered 2023-01-04: 2 mg via INTRAVENOUS

## 2023-01-04 MED ORDER — ENOXAPARIN SODIUM 40 MG/0.4ML IJ SOSY: 40.0000 mg | PREFILLED_SYRINGE | INTRAMUSCULAR | Status: DC

## 2023-01-04 MED ORDER — LIDOCAINE HCL (PF) 2 % IJ SOLN
INTRAMUSCULAR | Status: AC
  Filled 2023-01-04: qty 5

## 2023-01-04 MED ORDER — ROCURONIUM BROMIDE 10 MG/ML (PF) SYRINGE
PREFILLED_SYRINGE | INTRAVENOUS | Status: AC
  Filled 2023-01-04: qty 10

## 2023-01-04 MED ORDER — ORAL CARE MOUTH RINSE: 15.0000 mL | Freq: Once | OROMUCOSAL | Status: AC

## 2023-01-04 MED ORDER — SILVER SULFADIAZINE 1 % EX CREA
TOPICAL_CREAM | Freq: Every day | CUTANEOUS | 0 refills | Status: DC
Start: 2023-01-04 — End: 2023-04-27

## 2023-01-04 MED ORDER — LACTATED RINGERS IR SOLN
Status: DC | PRN
  Administered 2023-01-04: 1000 mL

## 2023-01-04 MED ORDER — BUPIVACAINE HCL 0.25 % IJ SOLN
INTRAMUSCULAR | Status: AC
  Filled 2023-01-04: qty 1

## 2023-01-04 MED ORDER — ONDANSETRON HCL 4 MG/2ML IJ SOLN
INTRAMUSCULAR | Status: DC | PRN
  Administered 2023-01-04 (×2): 4 mg via INTRAVENOUS

## 2023-01-04 MED ORDER — ACETIC ACID 5 % SOLN
Status: DC | PRN
  Administered 2023-01-04: 1 via TOPICAL

## 2023-01-04 MED ORDER — SENNOSIDES-DOCUSATE SODIUM 8.6-50 MG PO TABS: 2.0000 | ORAL_TABLET | Freq: Every day | ORAL | Status: DC

## 2023-01-04 MED ORDER — LACTATED RINGERS IV SOLN: INTRAVENOUS | Status: DC | PRN

## 2023-01-04 MED ORDER — ONDANSETRON HCL 4 MG/2ML IJ SOLN
INTRAMUSCULAR | Status: AC
  Filled 2023-01-04: qty 2

## 2023-01-04 MED ORDER — SILVER SULFADIAZINE 1 % EX CREA
TOPICAL_CREAM | CUTANEOUS | Status: AC
  Filled 2023-01-04: qty 50

## 2023-01-04 MED ORDER — ONDANSETRON HCL 4 MG/2ML IJ SOLN: 4.0000 mg | Freq: Four times a day (QID) | INTRAMUSCULAR | Status: DC | PRN

## 2023-01-04 MED ORDER — AMLODIPINE BESYLATE 10 MG PO TABS: 10.0000 mg | ORAL_TABLET | Freq: Every day | ORAL | Status: DC

## 2023-01-04 MED ORDER — LABETALOL HCL 5 MG/ML IV SOLN
INTRAVENOUS | Status: AC
  Filled 2023-01-04: qty 4

## 2023-01-04 MED ORDER — SILVER SULFADIAZINE 1 % EX CREA
TOPICAL_CREAM | Freq: Every day | CUTANEOUS | Status: DC
  Administered 2023-01-04: 1 via TOPICAL
  Filled 2023-01-04: qty 50

## 2023-01-04 MED ORDER — IODINE STRONG (LUGOLS) 5 % PO SOLN
ORAL | Status: AC
  Filled 2023-01-04: qty 1

## 2023-01-04 MED ORDER — SILVER NITRATE-POT NITRATE 75-25 % EX MISC
CUTANEOUS | Status: AC
  Filled 2023-01-04: qty 10

## 2023-01-04 MED ORDER — ACETAMINOPHEN 500 MG PO TABS: 1000.0000 mg | ORAL_TABLET | Freq: Four times a day (QID) | ORAL | Status: DC

## 2023-01-04 MED ORDER — PROPOFOL 10 MG/ML IV BOLUS
INTRAVENOUS | Status: DC | PRN
  Administered 2023-01-04: 160 mg via INTRAVENOUS

## 2023-01-04 MED ORDER — SUGAMMADEX SODIUM 200 MG/2ML IV SOLN
INTRAVENOUS | Status: DC | PRN
  Administered 2023-01-04: 200 mg via INTRAVENOUS

## 2023-01-04 MED ORDER — MIDAZOLAM HCL 2 MG/2ML IJ SOLN
INTRAMUSCULAR | Status: AC
  Filled 2023-01-04: qty 2

## 2023-01-04 MED ORDER — GABAPENTIN 300 MG PO CAPS
300.0000 mg | ORAL_CAPSULE | ORAL | Status: AC
  Administered 2023-01-04: 300 mg via ORAL
  Filled 2023-01-04: qty 1

## 2023-01-04 MED ORDER — DEXAMETHASONE SODIUM PHOSPHATE 10 MG/ML IJ SOLN
INTRAMUSCULAR | Status: AC
  Filled 2023-01-04: qty 1

## 2023-01-04 MED ORDER — LIDOCAINE 2% (20 MG/ML) 5 ML SYRINGE
INTRAMUSCULAR | Status: DC | PRN
  Administered 2023-01-04: 100 mg via INTRAVENOUS

## 2023-01-04 MED ORDER — FENTANYL CITRATE (PF) 100 MCG/2ML IJ SOLN
INTRAMUSCULAR | Status: AC
  Filled 2023-01-04: qty 2

## 2023-01-04 MED ORDER — LABETALOL HCL 5 MG/ML IV SOLN
INTRAVENOUS | Status: DC | PRN
  Administered 2023-01-04 (×3): 5 mg via INTRAVENOUS

## 2023-01-04 MED ORDER — ROCURONIUM BROMIDE 10 MG/ML (PF) SYRINGE
PREFILLED_SYRINGE | INTRAVENOUS | Status: DC | PRN
  Administered 2023-01-04 (×3): 20 mg via INTRAVENOUS
  Administered 2023-01-04: 60 mg via INTRAVENOUS

## 2023-01-04 MED ORDER — FENTANYL CITRATE PF 50 MCG/ML IJ SOSY
PREFILLED_SYRINGE | INTRAMUSCULAR | Status: AC
  Administered 2023-01-04: 50 ug via INTRAVENOUS
  Filled 2023-01-04: qty 3

## 2023-01-04 MED ORDER — GABAPENTIN 300 MG PO CAPS: 300.0000 mg | ORAL_CAPSULE | Freq: Three times a day (TID) | ORAL | Status: DC

## 2023-01-04 MED ORDER — KCL IN DEXTROSE-NACL 20-5-0.45 MEQ/L-%-% IV SOLN
INTRAVENOUS | Status: DC
  Filled 2023-01-04: qty 1000

## 2023-01-04 MED ORDER — KETOROLAC TROMETHAMINE 15 MG/ML IJ SOLN: 15.0000 mg | INTRAMUSCULAR | Status: DC

## 2023-01-04 MED ORDER — DEXAMETHASONE SODIUM PHOSPHATE 10 MG/ML IJ SOLN
INTRAMUSCULAR | Status: DC | PRN
  Administered 2023-01-04: 10 mg via INTRAVENOUS

## 2023-01-04 MED ORDER — IBUPROFEN 200 MG PO TABS: 600.0000 mg | ORAL_TABLET | Freq: Four times a day (QID) | ORAL | Status: DC

## 2023-01-04 MED ORDER — LACTATED RINGERS IV SOLN: INTRAVENOUS | Status: DC

## 2023-01-04 MED ORDER — KETOROLAC TROMETHAMINE 15 MG/ML IJ SOLN
15.0000 mg | Freq: Four times a day (QID) | INTRAMUSCULAR | Status: DC
  Administered 2023-01-04: 15 mg via INTRAVENOUS
  Filled 2023-01-04: qty 1

## 2023-01-04 MED ORDER — LIDOCAINE HCL (PF) 1 % IJ SOLN
INTRAMUSCULAR | Status: AC
  Filled 2023-01-04: qty 30

## 2023-01-04 MED ORDER — HEPARIN SODIUM (PORCINE) 5000 UNIT/ML IJ SOLN
5000.0000 [IU] | INTRAMUSCULAR | Status: AC
  Administered 2023-01-04: 5000 [IU] via SUBCUTANEOUS
  Filled 2023-01-04: qty 1

## 2023-01-04 MED ORDER — FENTANYL CITRATE (PF) 250 MCG/5ML IJ SOLN
INTRAMUSCULAR | Status: DC | PRN
  Administered 2023-01-04 (×5): 50 ug via INTRAVENOUS
  Administered 2023-01-04: 100 ug via INTRAVENOUS

## 2023-01-04 MED ORDER — PHENYLEPHRINE HCL (PRESSORS) 10 MG/ML IV SOLN
INTRAVENOUS | Status: AC
  Filled 2023-01-04: qty 1

## 2023-01-04 MED ORDER — STERILE WATER FOR INJECTION IJ SOLN
INTRAMUSCULAR | Status: DC | PRN
  Administered 2023-01-04: 10 mL via INTRAMUSCULAR

## 2023-01-04 MED ORDER — BRIMONIDINE TARTRATE 0.2 % OP SOLN
1.0000 [drp] | Freq: Two times a day (BID) | OPHTHALMIC | Status: DC
  Filled 2023-01-04: qty 5

## 2023-01-04 MED ORDER — ACETIC ACID 5 % SOLN
Status: AC
  Filled 2023-01-04: qty 50

## 2023-01-04 MED ORDER — ACETAMINOPHEN 500 MG PO TABS
1000.0000 mg | ORAL_TABLET | ORAL | Status: AC
  Administered 2023-01-04: 1000 mg via ORAL
  Filled 2023-01-04: qty 2

## 2023-01-04 MED ORDER — STERILE WATER FOR INJECTION IJ SOLN
INTRAMUSCULAR | Status: DC | PRN
  Administered 2023-01-04: 4 mL via INTRAMUSCULAR

## 2023-01-04 MED ORDER — FERRIC SUBSULFATE 259 MG/GM EX SOLN
CUTANEOUS | Status: AC
  Filled 2023-01-04: qty 8

## 2023-01-04 MED ORDER — OXYCODONE HCL 5 MG PO TABS: 5.0000 mg | ORAL_TABLET | ORAL | Status: DC | PRN

## 2023-01-04 MED ORDER — ONDANSETRON HCL 4 MG PO TABS: 4.0000 mg | ORAL_TABLET | Freq: Four times a day (QID) | ORAL | Status: DC | PRN

## 2023-01-04 MED ORDER — DORZOLAMIDE HCL-TIMOLOL MAL 2-0.5 % OP SOLN
1.0000 [drp] | Freq: Two times a day (BID) | OPHTHALMIC | Status: DC
  Filled 2023-01-04: qty 10

## 2023-01-04 SURGICAL SUPPLY — 114 items
ADH SKN CLS APL DERMABOND .7 (GAUZE/BANDAGES/DRESSINGS) ×1
AGENT HMST KT MTR STRL THRMB (HEMOSTASIS)
APL ESCP 34 STRL LF DISP (HEMOSTASIS)
APL PRP STRL LF DISP 70% ISPRP (MISCELLANEOUS) ×1
APPLICATOR SURGIFLO ENDO (HEMOSTASIS) IMPLANT
BAG LAPAROSCOPIC 12 15 PORT 16 (BASKET) IMPLANT
BAG RETRIEVAL 12/15 (BASKET)
BLADE SURG 15 STRL LF DISP TIS (BLADE) IMPLANT
BLADE SURG 15 STRL SS (BLADE)
BLADE SURG SZ10 CARB STEEL (BLADE) IMPLANT
BRIEF MESH DISP LRG (UNDERPADS AND DIAPERS) IMPLANT
CHLORAPREP W/TINT 26 (MISCELLANEOUS) IMPLANT
COVER BACK TABLE 60X90IN (DRAPES) ×1 IMPLANT
COVER TIP SHEARS 8 DVNC (MISCELLANEOUS) ×1 IMPLANT
DEPRESSOR TONGUE 6 IN STERILE (GAUZE/BANDAGES/DRESSINGS) ×1 IMPLANT
DERMABOND ADVANCED .7 DNX12 (GAUZE/BANDAGES/DRESSINGS) ×1 IMPLANT
DRAPE ARM DVNC X/XI (DISPOSABLE) ×4 IMPLANT
DRAPE COLUMN DVNC XI (DISPOSABLE) ×1 IMPLANT
DRAPE SHEET LG 3/4 BI-LAMINATE (DRAPES) ×1 IMPLANT
DRAPE SURG IRRIG POUCH 19X23 (DRAPES) ×1 IMPLANT
DRAPE UNDERBUTTOCKS STRL (DISPOSABLE) IMPLANT
DRIVER NDL MEGA SUTCUT DVNCXI (INSTRUMENTS) ×1 IMPLANT
DRIVER NDLE MEGA SUTCUT DVNCXI (INSTRUMENTS) ×1 IMPLANT
DRSG OPSITE POSTOP 4X6 (GAUZE/BANDAGES/DRESSINGS) IMPLANT
DRSG OPSITE POSTOP 4X8 (GAUZE/BANDAGES/DRESSINGS) IMPLANT
DRSG TEGADERM 8X12 (GAUZE/BANDAGES/DRESSINGS) IMPLANT
DRSG TELFA 3X8 NADH STRL (GAUZE/BANDAGES/DRESSINGS) IMPLANT
ELECT BALL LEEP 3MM BLK (ELECTRODE) IMPLANT
ELECT PENCIL ROCKER SW 15FT (MISCELLANEOUS) IMPLANT
ELECT REM PT RETURN 15FT ADLT (MISCELLANEOUS) ×1 IMPLANT
FORCEPS BPLR FENES DVNC XI (FORCEP) ×1 IMPLANT
FORCEPS PROGRASP DVNC XI (FORCEP) ×1 IMPLANT
GAUZE 4X4 16PLY ~~LOC~~+RFID DBL (SPONGE) ×1 IMPLANT
GLOVE BIO SURGEON STRL SZ 6 (GLOVE) ×4 IMPLANT
GLOVE BIO SURGEON STRL SZ 6.5 (GLOVE) ×1 IMPLANT
GLOVE BIOGEL PI IND STRL 6.5 (GLOVE) ×2 IMPLANT
GOWN STRL REUS W/ TWL LRG LVL3 (GOWN DISPOSABLE) ×4 IMPLANT
GOWN STRL REUS W/TWL LRG LVL3 (GOWN DISPOSABLE) ×5
GRASPER SUT TROCAR 14GX15 (MISCELLANEOUS) IMPLANT
HOLDER FOLEY CATH W/STRAP (MISCELLANEOUS) IMPLANT
IRRIG SUCT STRYKERFLOW 2 WTIP (MISCELLANEOUS) ×1
IRRIGATION SUCT STRKRFLW 2 WTP (MISCELLANEOUS) ×1 IMPLANT
KIT PROCEDURE DVNC SI (MISCELLANEOUS) IMPLANT
KIT TURNOVER KIT A (KITS) IMPLANT
LIGASURE IMPACT 36 18CM CVD LR (INSTRUMENTS) IMPLANT
MANIPULATOR ADVINCU DEL 3.0 PL (MISCELLANEOUS) IMPLANT
MANIPULATOR ADVINCU DEL 3.5 PL (MISCELLANEOUS) IMPLANT
MANIPULATOR UTERINE 4.5 ZUMI (MISCELLANEOUS) IMPLANT
NDL HYPO 21X1.5 SAFETY (NEEDLE) ×1 IMPLANT
NDL HYPO 22X1.5 SAFETY MO (MISCELLANEOUS) IMPLANT
NDL INSUFFLATION 14GA 120MM (NEEDLE) IMPLANT
NDL SPNL 20GX3.5 QUINCKE YW (NEEDLE) IMPLANT
NDL SPNL 22GX3.5 QUINCKE BK (NEEDLE) IMPLANT
NDL SPNL 22GX7 QUINCKE BK (NEEDLE) IMPLANT
NEEDLE HYPO 21X1.5 SAFETY (NEEDLE) ×1 IMPLANT
NEEDLE HYPO 22X1.5 SAFETY MO (MISCELLANEOUS) IMPLANT
NEEDLE INSUFFLATION 14GA 120MM (NEEDLE) IMPLANT
NEEDLE SPNL 20GX3.5 QUINCKE YW (NEEDLE) ×1 IMPLANT
NEEDLE SPNL 22GX3.5 QUINCKE BK (NEEDLE) ×1 IMPLANT
NEEDLE SPNL 22GX7 QUINCKE BK (NEEDLE) IMPLANT
OBTURATOR OPTICAL STND 8 DVNC (TROCAR) ×1
OBTURATOR OPTICALSTD 8 DVNC (TROCAR) ×1 IMPLANT
PACK LITHOTOMY IV (CUSTOM PROCEDURE TRAY) ×1 IMPLANT
PACK PERINEAL COLD (PAD) IMPLANT
PACK ROBOT GYN CUSTOM WL (TRAY / TRAY PROCEDURE) ×1 IMPLANT
PAD ARMBOARD 7.5X6 YLW CONV (MISCELLANEOUS) ×1 IMPLANT
PAD POSITIONING PINK XL (MISCELLANEOUS) ×1 IMPLANT
PAD PREP 24X48 CUFFED NSTRL (MISCELLANEOUS) ×1 IMPLANT
PORT ACCESS TROCAR AIRSEAL 12 (TROCAR) IMPLANT
PUNCH BIOPSY 3 (MISCELLANEOUS) IMPLANT
SCISSORS LAP 5X35 DISP (ENDOMECHANICALS) IMPLANT
SCISSORS MNPLR CVD DVNC XI (INSTRUMENTS) ×1 IMPLANT
SCOPETTES 8 STERILE (MISCELLANEOUS) ×2 IMPLANT
SCRUB CHG 4% DYNA-HEX 4OZ (MISCELLANEOUS) ×2 IMPLANT
SEAL UNIV 5-12 XI (MISCELLANEOUS) ×4 IMPLANT
SET TRI-LUMEN FLTR TB AIRSEAL (TUBING) ×1 IMPLANT
SOL PREP POV-IOD 4OZ 10% (MISCELLANEOUS) ×1 IMPLANT
SPIKE FLUID TRANSFER (MISCELLANEOUS) ×1 IMPLANT
SPONGE T-LAP 18X18 ~~LOC~~+RFID (SPONGE) IMPLANT
SURGIFLO W/THROMBIN 8M KIT (HEMOSTASIS) IMPLANT
SUT MNCRL AB 4-0 PS2 18 (SUTURE) IMPLANT
SUT PDS AB 1 TP1 54 (SUTURE) IMPLANT
SUT VIC AB 0 CT1 27 (SUTURE)
SUT VIC AB 0 CT1 27XBRD ANTBC (SUTURE) IMPLANT
SUT VIC AB 0 CT1 36 (SUTURE) IMPLANT
SUT VIC AB 2-0 CT1 27 (SUTURE)
SUT VIC AB 2-0 CT1 TAPERPNT 27 (SUTURE) IMPLANT
SUT VIC AB 2-0 SH 27 (SUTURE)
SUT VIC AB 2-0 SH 27XBRD (SUTURE) IMPLANT
SUT VIC AB 3-0 PS2 18 (SUTURE)
SUT VIC AB 3-0 PS2 18XBRD (SUTURE) IMPLANT
SUT VIC AB 3-0 SH 27 (SUTURE) ×1
SUT VIC AB 3-0 SH 27X BRD (SUTURE) IMPLANT
SUT VIC AB 4-0 PS2 18 (SUTURE) ×2 IMPLANT
SUT VICRYL 0 27 CT2 27 ABS (SUTURE) IMPLANT
SUT VICRYL 0 UR6 27IN ABS (SUTURE) IMPLANT
SUT VLOC 180 0 9IN GS21 (SUTURE) IMPLANT
SYR 10ML LL (SYRINGE) IMPLANT
SYR CONTROL 10ML LL (SYRINGE) ×1 IMPLANT
SYS BAG RETRIEVAL 10MM (BASKET)
SYS WOUND ALEXIS 18CM MED (MISCELLANEOUS)
SYSTEM BAG RETRIEVAL 10MM (BASKET) IMPLANT
SYSTEM WOUND ALEXIS 18CM MED (MISCELLANEOUS) IMPLANT
TOWEL OR 17X26 10 PK STRL BLUE (TOWEL DISPOSABLE) IMPLANT
TOWEL OR NON WOVEN STRL DISP B (DISPOSABLE) IMPLANT
TRAP SPECIMEN MUCUS 40CC (MISCELLANEOUS) IMPLANT
TRAY FOLEY MTR SLVR 16FR STAT (SET/KITS/TRAYS/PACK) ×1 IMPLANT
TROCAR PORT AIRSEAL 5X120 (TROCAR) IMPLANT
TUBING CONNECTING 10 (TUBING) IMPLANT
UNDERPAD 30X36 HEAVY ABSORB (UNDERPADS AND DIAPERS) ×2 IMPLANT
VACUUM HOSE 7/8X10 W/ WAND (MISCELLANEOUS) ×1 IMPLANT
WATER STERILE IRR 1000ML POUR (IV SOLUTION) ×1 IMPLANT
WATER STERILE IRR 500ML POUR (IV SOLUTION) ×1 IMPLANT
YANKAUER SUCT BULB TIP 10FT TU (MISCELLANEOUS) IMPLANT

## 2023-01-04 NOTE — Discharge Summary (Signed)
  Physician Discharge Summary  Patient ID: Anna Case MRN: [id number] DOB/AGE: 08-24-1970 52 y.o.  Admit date: 01/04/2023 Discharge date: 01/04/2023  Admission Diagnoses: Endometrial cancer [facility])  Discharge Diagnoses:  Principal Problem:   Endometrial cancer [facility]) Active Problems:   Endometrial ca [facility])   Discharged Condition:  The patient is in good condition and stable for discharge.   Hospital Course: On 01/04/2023, the patient underwent the following: Procedure(s): XI ROBOTIC ASSISTED TOTAL HYSTERECTOMY WITH BILATERAL SALPINGO OOPHORECTOMY SENTINEL NODE BIOPSY CO2 LASER ABLATION TO VULVA.  The postoperative course was uneventful.  She was discharged to home on postoperative day 0 as pt was feeling well, pain well controlled, tolerating a regular diet.  Consults: None  Significant Diagnostic Studies: None  Treatments: surgery: as above  Discharge Exam: Blood pressure (!) 187/85, pulse (!) 103, temperature 97.7 F (36.5 C), temperature source Oral, resp. rate 16, height 5' 8.5" (1.74 m), weight 235 lb (106.6 kg), last menstrual period 01/04/2023, SpO2 100 %. General appearance: alert, cooperative, and no distress Head: Normocephalic, without obvious abnormality, atraumatic Resp: normal work of breathing GI: soft, appropriately-tender Extremities: extremities normal, atraumatic, no cyanosis or edema Skin: Skin color, texture, turgor normal. No rashes or lesions Incision/Wound: robotic incisions with glue   Disposition: Discharge disposition: 01-Home or Self Care        Allergies as of 01/04/2023   No Known Allergies      Medication List     TAKE these medications    acetaminophen 500 MG tablet Commonly known as: TYLENOL Take 500 mg by mouth every 8 (eight) hours as needed for moderate pain.   amLODipine 10 MG tablet Commonly known as: NORVASC Take 10 mg by mouth daily.   ascorbic acid 500 MG tablet Commonly known as: VITAMIN C Take 500 mg by  mouth daily.   brimonidine 0.2 % ophthalmic solution Commonly known as: ALPHAGAN Place 1 drop into both eyes in the morning and at bedtime.   clobetasol cream 0.05 % Commonly known as: TEMOVATE Apply 1 Application topically daily as needed (itching).   clotrimazole 1 % cream Commonly known as: LOTRIMIN Apply 1 Application topically 2 (two) times daily. Apply for 4 weeks.   dorzolamide-timolol 2-0.5 % ophthalmic solution Commonly known as: COSOPT Place 1 drop into both eyes 2 (two) times daily.   ferrous sulfate 325 (65 FE) MG tablet Take 325 mg by mouth daily with breakfast.   ibuprofen 800 MG tablet Commonly known as: ADVIL Take 1 tablet (800 mg total) by mouth every 8 (eight) hours as needed for moderate pain. For AFTER surgery only   oxyCODONE 5 MG immediate release tablet Commonly known as: Oxy IR/ROXICODONE Take 1 tablet (5 mg total) by mouth every 4 (four) hours as needed for severe pain. For AFTER surgery only, do not take and drive   Rocklatan 0.86-5.784 % Soln Generic drug: Netarsudil-Latanoprost Place 1 drop into both eyes at bedtime.   senna-docusate 8.6-50 MG tablet Commonly known as: Senokot-S Take 2 tablets by mouth at bedtime. For AFTER surgery, do not take if having diarrhea   silver sulfADIAZINE 1 % cream Commonly known as: SILVADENE Apply topically daily.         SignedClide Cliff 01/04/2023, 5:16 PM

## 2023-01-04 NOTE — Anesthesia Preprocedure Evaluation (Signed)
Anesthesia Evaluation  Patient identified by MRN, date of birth, ID band Patient awake    Reviewed: Allergy & Precautions, NPO status , Patient's Chart, lab work & pertinent test results  Airway Mallampati: II  TM Distance: >3 FB Neck ROM: Full    Dental no notable dental hx.    Pulmonary neg pulmonary ROS, Current Smoker and Patient abstained from smoking.   Pulmonary exam normal        Cardiovascular hypertension, Pt. on medications  Rhythm:Regular Rate:Normal     Neuro/Psych negative neurological ROS  negative psych ROS   GI/Hepatic Neg liver ROS,GERD  ,,  Endo/Other  negative endocrine ROS    Renal/GU negative Renal ROS  negative genitourinary   Musculoskeletal negative musculoskeletal ROS (+)    Abdominal Normal abdominal exam  (+)   Peds  Hematology  (+) Blood dyscrasia, anemia   Anesthesia Other Findings   Reproductive/Obstetrics                             Anesthesia Physical Anesthesia Plan  ASA: 3  Anesthesia Plan: General   Post-op Pain Management: Tylenol PO (pre-op)* and Gabapentin PO (pre-op)*   Induction: Intravenous  PONV Risk Score and Plan: 2 and Ondansetron, Dexamethasone, Midazolam and Treatment may vary due to age or medical condition  Airway Management Planned: Mask and Oral ETT  Additional Equipment: None  Intra-op Plan:   Post-operative Plan: Extubation in OR  Informed Consent: I have reviewed the patients History and Physical, chart, labs and discussed the procedure including the risks, benefits and alternatives for the proposed anesthesia with the patient or authorized representative who has indicated his/her understanding and acceptance.     Dental advisory given  Plan Discussed with: CRNA  Anesthesia Plan Comments: (Lab Results      Component                Value               Date                      WBC                      5.5                  12/22/2022                HGB                      13.6                12/22/2022                HCT                      43.4                12/22/2022                MCV                      85.8                12/22/2022                PLT  344                 12/22/2022             Lab Results      Component                Value               Date                      NA                       136                 12/22/2022                K                        3.9                 12/22/2022                CO2                      23                  12/22/2022                GLUCOSE                  151 (H)             12/22/2022                BUN                      13                  12/22/2022                CREATININE               0.77                12/22/2022                CALCIUM                  9.1                 12/22/2022                GFRNONAA                 >60                 12/22/2022           )       Anesthesia Quick Evaluation

## 2023-01-04 NOTE — Transfer of Care (Signed)
Immediate Anesthesia Transfer of Care Note  Patient: Anna Case  Procedure(s) Performed: XI ROBOTIC ASSISTED TOTAL HYSTERECTOMY WITH BILATERAL SALPINGO OOPHORECTOMY SENTINEL NODE BIOPSY CO2 LASER APPLICATION TO VULVA  Patient Location: PACU  Anesthesia Type:General  Level of Consciousness: drowsy and patient cooperative  Airway & Oxygen Therapy: Patient Spontanous Breathing and Patient connected to face mask oxygen  Post-op Assessment: Report given to RN and Post -op Vital signs reviewed and stable  Post vital signs: Reviewed and stable  Last Vitals:  Vitals Value Taken Time  BP 195/108   Temp    Pulse 71   Resp 12   SpO2 99%     Last Pain:  Vitals:   01/04/23 0730  TempSrc:   PainSc: 0-No pain         Complications: No notable events documented.

## 2023-01-04 NOTE — Op Note (Addendum)
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 Adhesion related complexity increased the duration of the procedure by 45 minutes.)   Surgeon: [name], MD  Assistants: Antionette Char, MD and (an MD assistant was necessary for tissue manipulation, management of robotic instrumentation, retraction and positioning due to the complexity of the case and hospital policies)  Anesthesia: General  Estimated Blood Loss: 20 mL    Fluids: 1900 ml, crystalloid  Urine Output: 400 ml, clear yellow  Findings: On speculum, bimanual exam, normal cervix with IUD strings visualized. Small mobile uterus. Verrucous lesions of the introitus posteriorly bilaterally. On entry, extensive adhesions of the omentum to the anterior abdominal wall from the level of the falciform ligament to the pelvis. Otherwise normal upper abdominal survey with normal liver surface and diaphragm. Normal appearing small and large bowel. Additional adhesions of the omentum to the posterior uterus, bilateral adnexa, the left pelvic sidewall and the posterior cul-de-sac, requiring extensive lysis of adhesions. Bilateral ovaries adherent to the ovarian fossa. Adhesions of the bladder peritoneum to the anterior lower uterine segment. Sentinel mapping on right to the external iliac (grossly enlarged appearing lymph node); sentinel mapping on left to the external iliac. With acetic acid applied, acetowhite  changes to the verrucous lesions of the posterior fourchette. Superficial laceration midline of posterior fourchette reapproximated for hemostasis with a figure of eight stitch of 4-0 vicryl.   Specimens:  ID Type Source Tests Collected by Time Destination  1 : Right external iliac sentinel lymph node Tissue PATH Sentinel Lymph Node SURGICAL PATHOLOGY [name], MD 01/04/2023 1029   2 : Left external iliac sentinel lymph node Tissue PATH Sentinel Lymph Node SURGICAL PATHOLOGY [name], MD 01/04/2023 1050   3 : UTERUS, CERVIX, BILATERAL TUBES AND OVARIES with IUD Tissue PATH Gyn tumor resection SURGICAL PATHOLOGY [name], MD 01/04/2023 1124     Complications:  None  Indications for Procedure: Anna Case is a 52 y.o. 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  Prior to the procedure, all risks, benefits, and alternatives were discussed and informed surgical consent was signed.  Procedure: Patient was taken to the operating room where general anesthesia was achieved.  She was positioned in dorsal lithotomy and prepped and draped.  A foley catheter was inserted into the bladder. 1 ml of dilute Indo-Cyanine dye was was injected at 1cm and 1mm deep at 3 and 9 o'clock in the cervical stroma.  The cervix was dilated and an Advincula uterine manipulator with a colpotomy ring was inserted into the uterus.  A 5 mm incision was made in the left upper quadrant near Palmer's point.  The abdomen was entered with a 5 mm OptiView trocar under direct visualization.  The abdomen was insufflated and additional trocars were placed as follows: an 8mm trocar superior to the umbilicus, one 8 mm robotic trocar in the lateral right abdomen, and one 8 mm robotic trocar in the left abdomen. Using monopolar scissors, the adhesions of the omentum to the  anterior abdominal wall were serially lysed with cold cut. With the majority of the adhesions lysed, a portion of omentum was noted to be adherent to the based of the umbilicus. This was lysed sharply with monopolar scissors and cautery. At this time, the anterior abdominal wall was clear and one additional 8mm robotic trocar was placed in the right abdomen, between the trocar in the midline superior to the umbilicus and the right lateral trocar. The patient placed in steep Trendelenburg. The left upper quadrant trocar was removed and replaced with a 12 mm airseal trocar.  All trocars were placed under direct visualization. The distal omentum was noted to still be adherent to the uterus, adnexa, pelvic sidewall and posterior cul-de-sac.  The bowels were moved into the upper abdomen, limited by the remaining adherent omentum.  The DaVinci robotic surgical system was brought to the patient's bedside and docked.  Additional lysis of adhesions were performed to dissect the omentum from the uterus, the right adnexa, the left pelvic sidewall and the posterior cul-de-sac. The adhesions of the omentum to the left adnexa were more dense, so the distal omentum was cauterized and transected with a small portion remaining on the left adnexa to be removed with the specimen.  The right round ligament was transected and the retroperitoneum entered.The right ureter was identified. The paravesical and pararectal spaces were opened, and the node was found to be located on the external iliac vein. This node appeared grossly enlarged. A sentinel lymph node dissection was performed taking care to avoid injury to the ureter, superior vesicle artery or obturator nerve. A similar procedure was performed on left with the sentinel lymph node located on the left external iliac vein. The sentinel lymph nodes mentioned above were identified and removed through the assistant trocar.  The right ureter was again identified, and the right  infundibulopelvic ligament was isolated, cauterized, and transected. The posterior peritoneum was opened to the colpotomy ring. The anterior peritoneum was opened and the bladder flap was created. The right uterine artery was skeletonized, cauterized, and transected at the level of the colpotomy ring. Additional cautery was used in a C-shaped fashion to allow the remainder of the broad, cardinal, and uterosacral ligaments with the uterine vessels to be transected and fall away from the colpotomy ring.  A similar procedure was performed on the contralateral side. A colpotomy was made circumferentially following the contours of the colpotomy ring.  The uterine specimen was removed through the vagina.    The vaginal cuff was closed with a running stitch of 0 Vicryl suture.  The pelvis was irrigated and all operative sites were found to be hemostatic.  All instruments were removed and the robot was taken from the patient's bedside. The fascia at the 12 mm incision was closed with 0 Vicryl using a PMI device. The abdomen was desufflated and all ports were removed. The skin at all incisions was closed with 4-0 Vicryl to reapproximate the subcutaneous tissue and 4-0 monocryl in a subcuticular fashion followed by surgical glue.  Attention was then turned to the vulva. A superficial laceration was noted of the posterior fourchette. Thi was made hemostatic with one figure of eight stitch of 4-0 vicryl. The vulva was then prepped again. A thorough exam of the vulva was done after placing a moist sponge with acetic acid on the vulva with the findings as noted above. Using the CO2 laser, energy was applied with 8 Watts of continuous power.  The ablation was performed down to  a second surgical layer.  The wound bed was hemostatic. Silvadene cream was applied to the wound bed.    Patient tolerated the procedure well. Sponge, lap, and instrument counts were correct.  Patient received 2 gm of Ancef prior to skin incision for  routine perioperative antibiotic prophylaxis.  She was extubated and taken to the PACU in stable condition.  [name], MD Gynecologic Oncology

## 2023-01-04 NOTE — Interval H&P Note (Signed)
History and Physical Interval Note:  01/04/2023 8:18 AM  Anna Case  has presented today for surgery, with the diagnosis of ENDOMETRIAL CANCER, VULVAR DYSPLASIA.  The various methods of treatment have been discussed with the patient and family. After consideration of risks, benefits and other options for treatment, the patient has consented to  Procedure(s): XI ROBOTIC ASSISTED TOTAL HYSTERECTOMY WITH BILATERAL SALPINGO OOPHORECTOMY (N/A) SENTINEL NODE BIOPSY (N/A) POSSIBLE LYMPH NODE DISSECTION (N/A) CO2 LASER APPLICATION TO VULVA, POSSIBLE VULVAR BIOPSIES (N/A) as a surgical intervention.  The patient's history has been reviewed, patient examined, no change in status, stable for surgery.  I have reviewed the patient's chart and labs.  Questions were answered to the patient's satisfaction.     Trissa Molina

## 2023-01-04 NOTE — Anesthesia Procedure Notes (Signed)
Procedure Name: Intubation Date/Time: 01/04/2023 9:12 AM  Performed by: Pearson Grippe, CRNAPre-anesthesia Checklist: Patient identified, Emergency Drugs available, Suction available and Patient being monitored Patient Re-evaluated:Patient Re-evaluated prior to induction Oxygen Delivery Method: Circle system utilized Preoxygenation: Pre-oxygenation with 100% oxygen Induction Type: IV induction Ventilation: Mask ventilation without difficulty and Oral airway inserted - appropriate to patient size Laryngoscope Size: Hyacinth Meeker and 2 Grade View: Grade I Tube type: Oral Tube size: 7.0 mm Number of attempts: 1 Airway Equipment and Method: Stylet and Oral airway Placement Confirmation: ETT inserted through vocal cords under direct vision, positive ETCO2 and breath sounds checked- equal and bilateral Secured at: 21 cm Tube secured with: Tape Dental Injury: Teeth and Oropharynx as per pre-operative assessment

## 2023-01-04 NOTE — Anesthesia Postprocedure Evaluation (Signed)
Anesthesia Post Note  Patient: GWYNNE KEMNITZ  Procedure(s) Performed: XI ROBOTIC ASSISTED TOTAL HYSTERECTOMY WITH BILATERAL SALPINGO OOPHORECTOMY SENTINEL NODE BIOPSY CO2 LASER APPLICATION TO VULVA     Patient location during evaluation: PACU Anesthesia Type: General Level of consciousness: awake and alert Pain management: pain level controlled Vital Signs Assessment: post-procedure vital signs reviewed and stable Respiratory status: spontaneous breathing, nonlabored ventilation, respiratory function stable and patient connected to nasal cannula oxygen Cardiovascular status: blood pressure returned to baseline and stable Postop Assessment: no apparent nausea or vomiting Anesthetic complications: no   No notable events documented.  Last Vitals:  Vitals:   01/04/23 1424 01/04/23 1530  BP: (!) 171/97 (!) 155/82  Pulse: 82 87  Resp: 16 16  Temp: 36.6 C 36.6 C  SpO2: 98% 98%    Last Pain:  Vitals:   01/04/23 1530  TempSrc: Oral  PainSc:                  Nelle Don Rontavious Albright

## 2023-01-04 NOTE — Progress Notes (Addendum)
Pt was given silver cream to take home with her. Silver cream was not used during hospital stay.  DC instructions were given to patient and family. All questions were answered and patient was taken to main entrance via NT.

## 2023-01-04 NOTE — Discharge Instructions (Signed)
01/04/2023  Return to work: 4-6 weeks if applicable  Activity: 1. Be up and out of the bed during the day.  Take a nap if needed.  You may walk up steps but be careful and use the hand rail.  Stair climbing will tire you more than you think, you may need to stop part way and rest.   2. No lifting or straining over 10 lbs, pushing, pulling, straining for 6 weeks.  3. Do not drive if you are taking narcotic pain medicine. You need to make sure your reaction time has returned and you can brake safely.  4. Shower daily.  Use your regular soap to bathe and when finished pat your incision dry; don't rub.  No tub baths until cleared by your surgeon.   5. No sexual activity and nothing in the vagina for 8-12 weeks.  6. You may experience a small amount of clear drainage from your incisions, which is normal.  If the drainage persists or increases, please call the office.  7. You may experience vaginal spotting after surgery or around the 6-8 week mark from surgery when the stitches at the top of the vagina begin to dissolve.  The spotting is normal but if you experience heavy bleeding, call our office.  8. Take Tylenol or ibuprofen first for pain and only use narcotic pain medication for severe pain not relieved by the Tylenol or Ibuprofen.  Monitor your Tylenol intake to a max of 4,000 mg.   9. Apply the silvadene cream daily to your vulva to help with healing, pain from the laser ablation.  Diet: 1. Low sodium Heart Healthy Diet is recommended.  2. It is safe to use a laxative, such as Miralax or Colace, if you have difficulty moving your bowels. You can take Sennakot at bedtime every evening to keep bowel movements regular and to prevent constipation.    Wound Care: 1. Keep clean and dry.  Shower daily.  Reasons to call the Doctor: Fever - Oral temperature greater than 100.4 degrees Fahrenheit Foul-smelling vaginal discharge Difficulty urinating Nausea and vomiting Increased pain at the  site of the incision that is unrelieved with pain medicine. Difficulty breathing with or without chest pain New calf pain especially if only on one side Sudden, continuing increased vaginal bleeding with or without clots.   Contacts: For questions or concerns you should contact:  Dr. Alvester Morin at [phone]  Warner Mccreedy, NP at [phone]  After Hours: call [phone] and have the GYN Oncologist paged/contacted

## 2023-01-04 NOTE — Brief Op Note (Signed)
01/04/2023  12:36 PM  PATIENT:  Anna Case  52 y.o. female  PRE-OPERATIVE DIAGNOSIS:  ENDOMETRIAL CANCER, VULVAR DYSPLASIA  POST-OPERATIVE DIAGNOSIS:  ENDOMETRIAL CANCER, VULVAR DYSPLASIA  PROCEDURE:  Procedure(s): XI ROBOTIC ASSISTED TOTAL HYSTERECTOMY WITH BILATERAL SALPINGO OOPHORECTOMY (N/A) SENTINEL NODE BIOPSY (N/A) CO2 LASER APPLICATION TO VULVA (N/A)  SURGEON:  Surgeon(s) and Role:    [name], MD - Primary    * Antionette Char, MD - Assisting  ANESTHESIA:   local and general  EBL:  20 mL   BLOOD ADMINISTERED:none  DRAINS: none   LOCAL MEDICATIONS USED:  MARCAINE     SPECIMEN:   ID Type Source Tests Collected by Time Destination  1 : Right external iliac sentinel lymph node Tissue PATH Sentinel Lymph Node SURGICAL PATHOLOGY [name], MD 01/04/2023 1029   2 : Left external iliac sentinel lymph node Tissue PATH Sentinel Lymph Node SURGICAL PATHOLOGY [name], MD 01/04/2023 1050   3 : UTERUS, CERVIX, BILATERAL TUBES AND OVARIES with IUD Tissue PATH Gyn tumor resection SURGICAL PATHOLOGY [name], MD 01/04/2023 1124      DISPOSITION OF SPECIMEN:  PATHOLOGY  COUNTS:  YES  TOURNIQUET:  * No tourniquets in log *  DICTATION: .Note written in EPIC  PLAN OF CARE: Admit for overnight observation  PATIENT DISPOSITION:  PACU - hemodynamically stable.   Delay start of Pharmacological VTE agent (>24hrs) due to surgical blood loss or risk of bleeding: not applicable

## 2023-01-05 ENCOUNTER — Encounter (HOSPITAL_COMMUNITY): Payer: Self-pay | Admitting: Psychiatry

## 2023-01-05 ENCOUNTER — Telehealth: Payer: Self-pay | Admitting: *Deleted

## 2023-01-05 NOTE — Telephone Encounter (Signed)
Spoke with Ms. Forde this morning. She states she is eating, drinking and urinating well. She has not had a BM yet but is passing gas. She is taking senokot as prescribed and encouraged her to drink plenty of water. She denies fever or chills. Incisions are dry and intact. She rates her pain 9/10. Her pain is controlled with tylenol and ibuprofen.     Instructed to call office with any fever, chills, purulent drainage, uncontrolled pain or any other questions or concerns. Patient verbalizes understanding.   Pt aware of post op appointments as well as the office number [phone] and after hours number [phone] to call if she has any questions or concerns

## 2023-01-06 DIAGNOSIS — Z0000 Encounter for general adult medical examination without abnormal findings: Secondary | ICD-10-CM | POA: Diagnosis not present

## 2023-01-06 DIAGNOSIS — D649 Anemia, unspecified: Secondary | ICD-10-CM | POA: Diagnosis not present

## 2023-01-06 DIAGNOSIS — Z79899 Other long term (current) drug therapy: Secondary | ICD-10-CM | POA: Diagnosis not present

## 2023-01-06 DIAGNOSIS — E7800 Pure hypercholesterolemia, unspecified: Secondary | ICD-10-CM | POA: Diagnosis not present

## 2023-01-06 DIAGNOSIS — E049 Nontoxic goiter, unspecified: Secondary | ICD-10-CM | POA: Diagnosis not present

## 2023-01-07 LAB — SURGICAL PATHOLOGY

## 2023-01-17 ENCOUNTER — Inpatient Hospital Stay: Payer: Medicare HMO | Attending: Psychiatry | Admitting: Psychiatry

## 2023-01-17 ENCOUNTER — Other Ambulatory Visit: Payer: Self-pay

## 2023-01-17 VITALS — BP 142/94 | HR 69 | Temp 97.9°F | Resp 16 | Wt 246.2 lb

## 2023-01-17 DIAGNOSIS — Z90710 Acquired absence of both cervix and uterus: Secondary | ICD-10-CM | POA: Diagnosis not present

## 2023-01-17 DIAGNOSIS — C541 Malignant neoplasm of endometrium: Secondary | ICD-10-CM | POA: Diagnosis not present

## 2023-01-17 DIAGNOSIS — C519 Malignant neoplasm of vulva, unspecified: Secondary | ICD-10-CM | POA: Diagnosis not present

## 2023-01-17 DIAGNOSIS — Z9079 Acquired absence of other genital organ(s): Secondary | ICD-10-CM | POA: Diagnosis not present

## 2023-01-17 DIAGNOSIS — E042 Nontoxic multinodular goiter: Secondary | ICD-10-CM | POA: Diagnosis not present

## 2023-01-17 DIAGNOSIS — L280 Lichen simplex chronicus: Secondary | ICD-10-CM | POA: Diagnosis not present

## 2023-01-17 DIAGNOSIS — B372 Candidiasis of skin and nail: Secondary | ICD-10-CM

## 2023-01-17 DIAGNOSIS — Z90722 Acquired absence of ovaries, bilateral: Secondary | ICD-10-CM | POA: Insufficient documentation

## 2023-01-17 DIAGNOSIS — E041 Nontoxic single thyroid nodule: Secondary | ICD-10-CM

## 2023-01-17 NOTE — Progress Notes (Signed)
Gynecologic Oncology Return Clinic Visit  Date of Service: 01/17/2023 Referring Provider: Mickeal Skinner, NP [address] [place],  [place] [address]  Assessment & Plan: Anna Case is a 52 y.o. 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   Postop: - Pt recovering well from surgery and healing appropriately postoperatively - Intraoperative findings and pathology results reviewed. - Ongoing postoperative expectations and precautions reviewed. Continue with no lifting >10lbs through 6 weeks postoperatively  Endometrial cancer: - Pathology results reviewed in detail - No high-intermediate risk factors. - Recommend observation. - Signs/symptoms of recurrence reviewed. - Surveillance reviewed. Follow-up q6 months x2 years then yearly.   Vulvar cancer: - Well healed - Persistent dysplasia treated with laser, healing well. - Surveillance for cancer recommended every 3-6 months for the first 2 years, then every 6-8mo for years 3-5. - Could consider annual pap; we will revisit.    Thyroid nodules: - Biopsy negative for malignancy. Shows benign follicular nodule.  - TSH, fT4 on preop labs wnl.    Lichen simplex chronicus with fungal infection: - Continue topical anti-fungal and topical steroid cream.   RTC 2mo.  [name], MD Gynecologic Oncology   Medical Decision Making I personally spent  TOTAL 25 minutes face-to-face and non-face-to-face in the care of this patient, which includes all pre, intra, and post visit time on the date of service.   ----------------------- Reason for Visit: Postop/Treatment counseling  Treatment History: Oncology History  Vulvar cancer (HCC)  09/20/2022 Initial Biopsy   FINAL MICROSCOPIC DIAGNOSIS:  A. VULVAR MASS, RIGHT,  BIOPSY:  - Invasive well to moderately differentiated squamous cell carcinoma,  see comment  B. INTROITUS, BIOPSY:  - Low-grade squamous intraepithelial lesion (VIN1, condyloma), see  comment    09/20/2022 Initial Diagnosis   Vulvar cancer   10/13/2022 Imaging   PET: IMPRESSION: Postop changes from right vulvectomy. No evidence of residual or recurrent vulvar mass.   Intense hypermetabolic activity throughout the endometrial cavity of the uterus, highly suspicious for endometrial carcinoma.   Sub-cm bilateral inguinal lymph nodes show mild hypermetabolism. Metastatic disease cannot be excluded.   Sub-centimeter bilateral axillary lymph nodes show mild hypermetabolism, and are indeterminate.   Focal hypermetabolism in lateral right thyroid lobe. Thyroid carcinoma cannot be excluded. Recommend thyroid US and biopsy. (ref: J Am Coll Radiol. 2015 Feb;12(2): 143-50).   Sub-cm bilateral upper neck lymph nodes show mild hypermetabolism. Metastatic disease cannot be excluded, possibly from thyroid carcinoma if the hypermetabolic right thyroid nodule is malignant.   Endometrial cancer (HCC)  10/18/2022 Initial Diagnosis   Endometrial cancer   10/18/2022 Initial Biopsy   FINAL MICROSCOPIC DIAGNOSIS:  A. ENDOMETRIUM, BIOPSY:  - Endometrioid carcinoma, FIGO grade 1  - See comment  COMMENT:  Part A: Immunohistochemical staining for p53 and MMR is pending and will  be reported in an addendum.  Dr. Reynolds Bowl reviewed the case and agrees with  the above diagnosis.  IHC EXPRESSION RESULTS  TEST           RESULT  MLH1:          Preserved nuclear expression  MSH2:          Preserved nuclear expression  MSH6:          Preserved nuclear expression  PMS2:  Preserved nuclear expression      Interval History: Pt reports that she is recovering well from surgery.  She is having occasional left upper quadrant pain/pinching sensation.  This is getting better every day.  She is eating and  drinking well.  She reports urinary frequency but no burning.  She thinks this is due to the drinking coffee.  She is having regular bowel movements.  Notes hot flashes about once or twice.  She denies any ongoing vaginal discharge or bleeding.    Past Medical/Surgical History: Past Medical History:  Diagnosis Date   Anemia    iron   Cancer (HCC)    vulva   GERD (gastroesophageal reflux disease)    Glaucoma    Hypertension    Pre-diabetes     Past Surgical History:  Procedure Laterality Date   CO2 LASER APPLICATION N/A 01/04/2023   Procedure: CO2 LASER APPLICATION TO VULVA;  Surgeon: [name], MD;  Location: [facility];  Service: Gynecology;  Laterality: N/A;   COLONOSCOPY W/ POLYPECTOMY     ROBOTIC ASSISTED TOTAL HYSTERECTOMY WITH BILATERAL SALPINGO OOPHERECTOMY N/A 01/04/2023   Procedure: XI ROBOTIC ASSISTED TOTAL HYSTERECTOMY WITH BILATERAL SALPINGO OOPHORECTOMY;  Surgeon: [name], MD;  Location: [facility];  Service: Gynecology;  Laterality: N/A;   SENTINEL NODE BIOPSY N/A 01/04/2023   Procedure: SENTINEL NODE BIOPSY;  Surgeon: [name], MD;  Location: [facility];  Service: Gynecology;  Laterality: N/A;   VULVA SURGERY      Family History  Problem Relation Age of Onset   Diabetes Mother    Heart attack Father    Prostate cancer Maternal Uncle 4       metastatic   Colon cancer Maternal Uncle    Prostate cancer Paternal Uncle 66   Breast cancer Other        maternal first cousin once removed   Colon cancer Other        grandmother's sister x2   Prostate cancer Other        grandmother's brother   Prostate cancer Other        grandfather's brother x2   Breast cancer Other        grandfather's sister   Ovarian cancer Neg Hx    Endometrial cancer Neg Hx    Pancreatic cancer Neg Hx     Social History   Socioeconomic History   Marital status: Single    Spouse name: Not on file   Number of children: Not on file   Years of education: Not on file    Highest education level: Not on file  Occupational History   Not on file  Tobacco Use   Smoking status: Every Day    Current packs/day: 0.25    Average packs/day: 0.3 packs/day for 20.0 years (5.0 ttl pk-yrs)    Types: Cigarettes   Smokeless tobacco: Never  Vaping Use   Vaping status: Never Used  Substance and Sexual Activity   Alcohol use: No   Drug use: Not Currently    Types: Marijuana   Sexual activity: Not Currently    Partners: Male  Other Topics Concern   Not on file  Social History Narrative   Not on file   Social Determinants of Health   Financial Resource Strain: Not on file  Food Insecurity: Food Insecurity Present (01/04/2023)   Hunger Vital Sign    Worried About Running Out of Food in the Last Year: Sometimes true    Ran Out of Food in the  Last Year: Sometimes true  Transportation Needs: No Transportation Needs (01/04/2023)   PRAPARE - Administrator, Civil Service (Medical): No    Lack of Transportation (Non-Medical): No  Physical Activity: Not on file  Stress: Not on file  Social Connections: Not on file    Current Medications:  Current Outpatient Medications:    acetaminophen (TYLENOL) 500 MG tablet, Take 500 mg by mouth every 8 (eight) hours as needed for moderate pain. (Patient not taking: Reported on 12/20/2022), Disp: , Rfl:    amLODipine (NORVASC) 10 MG tablet, Take 10 mg by mouth daily., Disp: , Rfl:    ascorbic acid (VITAMIN C) 500 MG tablet, Take 500 mg by mouth daily., Disp: , Rfl:    brimonidine (ALPHAGAN) 0.2 % ophthalmic solution, Place 1 drop into both eyes in the morning and at bedtime., Disp: , Rfl:    clobetasol cream (TEMOVATE) 0.05 %, Apply 1 Application topically daily as needed (itching)., Disp: 30 g, Rfl: 3   clotrimazole (LOTRIMIN) 1 % cream, Apply 1 Application topically 2 (two) times daily. Apply for 4 weeks., Disp: 60 g, Rfl: 0   dorzolamide-timolol (COSOPT) 22.3-6.8 MG/ML ophthalmic solution, Place 1 drop into both eyes  2 (two) times daily., Disp: , Rfl:    ferrous sulfate 325 (65 FE) MG tablet, Take 325 mg by mouth daily with breakfast. (Patient not taking: Reported on 12/20/2022), Disp: , Rfl:    ibuprofen (ADVIL) 800 MG tablet, Take 1 tablet (800 mg total) by mouth every 8 (eight) hours as needed for moderate pain. For AFTER surgery only, Disp: 30 tablet, Rfl: 0   oxyCODONE (OXY IR/ROXICODONE) 5 MG immediate release tablet, Take 1 tablet (5 mg total) by mouth every 4 (four) hours as needed for severe pain. For AFTER surgery only, do not take and drive, Disp: 15 tablet, Rfl: 0   ROCKLATAN 0.02-0.005 % SOLN, Place 1 drop into both eyes at bedtime., Disp: , Rfl:    senna-docusate (SENOKOT-S) 8.6-50 MG tablet, Take 2 tablets by mouth at bedtime. For AFTER surgery, do not take if having diarrhea, Disp: 30 tablet, Rfl: 1   silver sulfADIAZINE (SILVADENE) 1 % cream, Apply topically daily., Disp: 50 g, Rfl: 0  Review of Symptoms: Complete 10-system review is positive for: decreased appetite, urinary frequency, vision problems, weight gain, ringing in ears, hot flashes, vaginal discharge, itch  Physical Exam: BP (!) 142/94 (BP Location: Left Arm, Patient Position: Sitting)   Pulse 69   Temp 97.9 F (36.6 C) (Oral)   Resp 16   Wt 246 lb 3.2 oz (111.7 kg)   SpO2 100%   BMI 36.89 kg/m  General: Alert, oriented, no acute distress. HEENT: Normocephalic, atraumatic. Neck symmetric without masses. Sclera anicteric.  Chest: Normal work of breathing. Clear to auscultation bilaterally.   Cardiovascular: Regular rate and rhythm, no murmurs. Abdomen: Soft, nontender.  Normoactive bowel sounds.  No masses appreciated.  Well-healing incisions. Extremities: Grossly normal range of motion.  Warm, well perfused.  No edema bilaterally. Skin: No rashes or lesions noted. GU: External genitalia healing well from posterior vulvar laser. Posterior fourchette with stitch in place.  Speculum exam reveals vaginal cuff intact and healing  well.  Bimanual exam reveals intact vaginal cuff. Exam chaperoned by Leta Baptist, RN   Laboratory & Radiologic Studies: FINAL MICROSCOPIC DIAGNOSIS:  A. SENTINEL LYMPH NODE, RIGHT EXTERNAL ILIAC, EXCISION: - Lymph node with benign Mullerian inclusion, negative for carcinoma (0/1)  B. SENTINEL LYMPH NODE, LEFT EXTERNAL ILIAC, EXCISION: -  Lymph node with benign Mullerian inclusion, negative for carcinoma (0/1)  C. UTERUS, CERVIX, BILATERAL FALLOPIAN TUBES AND OVARIES WITH IUD: - Endometrioid adenocarcinoma, 5.2 cm, FIGO grade 1 - Carcinoma invades for a depth of 1 point centimeter the myometrial thickness is 2.7 cm - No evidence of lymphovascular invasion - Benign unremarkable cervix - Benign bilateral fallopian tubes and ovaries - Intrauterine T-shaped device - See oncology table      ONCOLOGY TABLE:  UTERUS, CARCINOMA OR CARCINOSARCOMA: Resection  Procedure: Total hysterectomy and bilateral salpingo-oophorectomy Histologic Type: Endometrioid adenocarcinoma Histologic Grade: FIGO grade 1 Myometrial Invasion:      Depth of Myometrial Invasion (mm): 12 mm      Myometrial Thickness (mm): 27 mm      Percentage of Myometrial Invasion: 45% Uterine Serosa Involvement: Not identified Cervical stromal Involvement: Not identified Extent of involvement of other tissue/organs: Not identified Peritoneal/Ascitic Fluid: Not applicable Lymphovascular Invasion: Not identified Regional Lymph Nodes:      Pelvic Lymph Nodes Examined:          2 Sentinel          0 Non-sentinel          2 Total      Pelvic Lymph Nodes with Metastasis: 0          Macrometastasis: (>2.0 mm): 0          Micrometastasis: (>0.2 mm and < 2.0 mm): 0          Isolated Tumor Cells (<0.2 mm): 0          Laterality of Lymph Node with Tumor: Not applicable          Extracapsular Extension: Not applicable      Para-aortic Lymph Nodes Examined:          0 Sentinel          0 Non-sentinel          0  Total Distant Metastasis:      Distant Site(s) Involved: Not applicable Pathologic Stage Classification (pTNM, AJCC 8th Edition): pT1A, pN0 Ancillary Studies: MMR / MSI testing will be ordered Representative Tumor Block: C4 Comment(s): Pancytokeratin was performed on the lymph nodes and highlights only benign mullerian inclusions.  Immunostain for p53 shows a normal, wild-type expression pattern.   IHC EXPRESSION RESULTS  TEST           RESULT MLH1:          Preserved nuclear expression MSH2:          Preserved nuclear expression MSH6:          Preserved nuclear expression PMS2:          Preserved nuclear expression

## 2023-01-17 NOTE — Patient Instructions (Signed)
It was a pleasure to see you in clinic today. - Healing well. Avoid lifting more than 10 lbs until 6 weeks postop and nothing in the vagina until 8 weeks postop. - Return visit planned for 3 months  Thank you very much for allowing me to provide care for you today.  I appreciate your confidence in choosing our Gynecologic Oncology team at [facility].  If you have any questions about your visit today please call our office or send [place] a MyChart message and we will get back to you as soon as possible.

## 2023-01-24 ENCOUNTER — Encounter: Payer: Self-pay | Admitting: Psychiatry

## 2023-02-25 DIAGNOSIS — H2513 Age-related nuclear cataract, bilateral: Secondary | ICD-10-CM | POA: Diagnosis not present

## 2023-02-25 DIAGNOSIS — H401133 Primary open-angle glaucoma, bilateral, severe stage: Secondary | ICD-10-CM | POA: Diagnosis not present

## 2023-02-25 DIAGNOSIS — H04123 Dry eye syndrome of bilateral lacrimal glands: Secondary | ICD-10-CM | POA: Diagnosis not present

## 2023-03-11 ENCOUNTER — Other Ambulatory Visit: Payer: Self-pay | Admitting: Student

## 2023-03-11 DIAGNOSIS — Z1231 Encounter for screening mammogram for malignant neoplasm of breast: Secondary | ICD-10-CM

## 2023-03-28 ENCOUNTER — Ambulatory Visit: Payer: Medicare HMO

## 2023-04-08 ENCOUNTER — Ambulatory Visit
Admission: RE | Admit: 2023-04-08 | Discharge: 2023-04-08 | Disposition: A | Discharge status: Home / Self Care | Payer: Medicare HMO | Source: Ambulatory Visit | Attending: Student

## 2023-04-08 DIAGNOSIS — Z1231 Encounter for screening mammogram for malignant neoplasm of breast: Secondary | ICD-10-CM | POA: Diagnosis not present

## 2023-04-18 ENCOUNTER — Inpatient Hospital Stay: Payer: Medicare HMO | Admitting: Psychiatry

## 2023-04-20 DIAGNOSIS — R6889 Other general symptoms and signs: Secondary | ICD-10-CM | POA: Diagnosis not present

## 2023-05-02 ENCOUNTER — Other Ambulatory Visit: Payer: Self-pay

## 2023-05-02 ENCOUNTER — Inpatient Hospital Stay: Payer: Medicare HMO | Attending: Psychiatry | Admitting: Psychiatry

## 2023-05-02 VITALS — BP 138/88 | HR 87 | Temp 98.5°F | Resp 19 | Wt 237.0 lb

## 2023-05-02 DIAGNOSIS — Z8544 Personal history of malignant neoplasm of other female genital organs: Secondary | ICD-10-CM | POA: Diagnosis not present

## 2023-05-02 DIAGNOSIS — Z90722 Acquired absence of ovaries, bilateral: Secondary | ICD-10-CM | POA: Diagnosis not present

## 2023-05-02 DIAGNOSIS — Z90711 Acquired absence of uterus with remaining cervical stump: Secondary | ICD-10-CM | POA: Diagnosis not present

## 2023-05-02 DIAGNOSIS — N903 Dysplasia of vulva, unspecified: Secondary | ICD-10-CM

## 2023-05-02 DIAGNOSIS — C541 Malignant neoplasm of endometrium: Secondary | ICD-10-CM

## 2023-05-02 DIAGNOSIS — R6889 Other general symptoms and signs: Secondary | ICD-10-CM | POA: Diagnosis not present

## 2023-05-02 DIAGNOSIS — Z8542 Personal history of malignant neoplasm of other parts of uterus: Secondary | ICD-10-CM

## 2023-05-02 DIAGNOSIS — E041 Nontoxic single thyroid nodule: Secondary | ICD-10-CM | POA: Diagnosis not present

## 2023-05-02 DIAGNOSIS — L02224 Furuncle of groin: Secondary | ICD-10-CM

## 2023-05-02 DIAGNOSIS — L280 Lichen simplex chronicus: Secondary | ICD-10-CM

## 2023-05-02 DIAGNOSIS — C519 Malignant neoplasm of vulva, unspecified: Secondary | ICD-10-CM

## 2023-05-02 DIAGNOSIS — N901 Moderate vulvar dysplasia: Secondary | ICD-10-CM | POA: Diagnosis not present

## 2023-05-02 DIAGNOSIS — B977 Papillomavirus as the cause of diseases classified elsewhere: Secondary | ICD-10-CM | POA: Diagnosis not present

## 2023-05-02 NOTE — Patient Instructions (Signed)
Today you had a biopsy of your vulva/entrance of the vagina. You may have a grayish discharge or light spotting from this. Rinse the area after toileting and pat dry for the next several days while the area is healing.   We will inform Dr. Alvester Morin about your hot flashes to see what she recommends.  Plan on following up in the office in three months or sooner if needed. Please call for any new symptoms.

## 2023-05-02 NOTE — Progress Notes (Unsigned)
Gynecologic Oncology Return Clinic Visit  Date of Service: 05/02/2023 Referring Provider: Mickeal Skinner, NP [address] [place],  [place] [address]  Assessment & Plan: Anna Case is a 52 y.o. 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  ***  Endometrial cancer: - Pathology results reviewed in detail - No high-intermediate risk factors. - Recommend observation. - Signs/symptoms of recurrence reviewed. - Surveillance reviewed. Follow-up q6 months x2 years then yearly.   Vulvar cancer: - Well healed - Persistent dysplasia treated with laser, healing well. - Surveillance for cancer recommended every 3-6 months for the first 2 years, then every 6-417mo for years 3-5. - Could consider annual pap; we will revisit.    Thyroid nodules: - Biopsy negative for malignancy. Shows benign follicular nodule.  - TSH, fT4 on preop labs wnl.    Lichen simplex chronicus with fungal infection: - Continue topical anti-fungal and topical steroid cream.   RTC 17mo.  [name], MD Gynecologic Oncology   Medical Decision Making I personally spent  TOTAL *** minutes face-to-face and non-face-to-face in the care of this patient, which includes all pre, intra, and post visit time on the date of service.   ----------------------- Reason for Visit: ***  Treatment History: Oncology History  Vulvar cancer (HCC)  09/20/2022 Initial Biopsy   FINAL MICROSCOPIC DIAGNOSIS:  A. VULVAR MASS, RIGHT, BIOPSY:  - Invasive well to moderately differentiated squamous cell carcinoma,  see comment  B. INTROITUS, BIOPSY:  - Low-grade squamous intraepithelial lesion (VIN1, condyloma), see  comment    09/20/2022 Initial Diagnosis   Vulvar cancer   10/13/2022 Imaging   PET: IMPRESSION: Postop  changes from right vulvectomy. No evidence of residual or recurrent vulvar mass.   Intense hypermetabolic activity throughout the endometrial cavity of the uterus, highly suspicious for endometrial carcinoma.   Sub-cm bilateral inguinal lymph nodes show mild hypermetabolism. Metastatic disease cannot be excluded.   Sub-centimeter bilateral axillary lymph nodes show mild hypermetabolism, and are indeterminate.   Focal hypermetabolism in lateral right thyroid lobe. Thyroid carcinoma cannot be excluded. Recommend thyroid US and biopsy. (ref: J Am Coll Radiol. 2015 Feb;12(2): 143-50).   Sub-cm bilateral upper neck lymph nodes show mild hypermetabolism. Metastatic disease cannot be excluded, possibly from thyroid carcinoma if the hypermetabolic right thyroid nodule is malignant.   Endometrial cancer (HCC)  10/18/2022 Initial Diagnosis   Endometrial cancer   10/18/2022 Initial Biopsy   FINAL MICROSCOPIC DIAGNOSIS:  A. ENDOMETRIUM, BIOPSY:  - Endometrioid carcinoma, FIGO grade 1  - See comment  COMMENT:  Part A: Immunohistochemical staining for p53 and MMR is pending and will  be reported in an addendum.  Dr. Reynolds Bowl reviewed the case and agrees with  the above diagnosis.  IHC EXPRESSION RESULTS  TEST           RESULT  MLH1:          Preserved nuclear expression  MSH2:          Preserved nuclear expression  MSH6:          Preserved nuclear expression  PMS2:          Preserved nuclear expression      Interval History: *** Had a boil in her left groin but drained night before last No new vaginal bleeding,  abdominal/pelvic pain, unintentional weight loss, change in bowel or bladder habits, early satiety, bloating, nausea/vomiting.      Past Medical/Surgical History: Past Medical History:  Diagnosis Date   Anemia    iron   Cancer (HCC)    vulva   GERD (gastroesophageal reflux disease)    Glaucoma    Hypertension    Pre-diabetes     Past Surgical History:  Procedure  Laterality Date   CO2 LASER APPLICATION N/A 01/04/2023   Procedure: CO2 LASER APPLICATION TO VULVA;  Surgeon: [name], MD;  Location: [facility];  Service: Gynecology;  Laterality: N/A;   COLONOSCOPY W/ POLYPECTOMY     ROBOTIC ASSISTED TOTAL HYSTERECTOMY WITH BILATERAL SALPINGO OOPHERECTOMY N/A 01/04/2023   Procedure: XI ROBOTIC ASSISTED TOTAL HYSTERECTOMY WITH BILATERAL SALPINGO OOPHORECTOMY;  Surgeon: [name], MD;  Location: [facility];  Service: Gynecology;  Laterality: N/A;   SENTINEL NODE BIOPSY N/A 01/04/2023   Procedure: SENTINEL NODE BIOPSY;  Surgeon: [name], MD;  Location: [facility];  Service: Gynecology;  Laterality: N/A;   VULVA SURGERY      Family History  Problem Relation Age of Onset   Diabetes Mother    Heart attack Father    Prostate cancer Maternal Uncle 38       metastatic   Colon cancer Maternal Uncle    Prostate cancer Paternal Uncle 9   Breast cancer Other        maternal first cousin once removed   Colon cancer Other        grandmother's sister x2   Prostate cancer Other        grandmother's brother   Prostate cancer Other        grandfather's brother x2   Breast cancer Other        grandfather's sister   Ovarian cancer Neg Hx    Endometrial cancer Neg Hx    Pancreatic cancer Neg Hx     Social History   Socioeconomic History   Marital status: Single    Spouse name: Not on file   Number of children: Not on file   Years of education: Not on file   Highest education level: Not on file  Occupational History   Not on file  Tobacco Use   Smoking status: Every Day    Current packs/day: 0.25    Average packs/day: 0.3 packs/day for 20.0 years (5.0 ttl pk-yrs)    Types: Cigarettes   Smokeless tobacco: Never  Vaping Use   Vaping status: Never Used  Substance and Sexual Activity   Alcohol use: No   Drug use: Not Currently    Types: Marijuana   Sexual activity: Not Currently    Partners: Male  Other Topics Concern   Not on file   Social History Narrative   Not on file   Social Determinants of Health   Financial Resource Strain: Not on file  Food Insecurity: Food Insecurity Present (01/04/2023)   Hunger Vital Sign    Worried About Running Out of Food in the Last Year: Sometimes true    Ran Out of Food in the Last Year: Sometimes true  Transportation Needs: No Transportation Needs (01/04/2023)   PRAPARE - Administrator, Civil Service (Medical): No    Lack of Transportation (Non-Medical): No  Physical Activity: Not on file  Stress: Not on file  Social Connections: Not on file    Current Medications:  Current Outpatient Medications:    amLODipine (NORVASC) 10 MG tablet, Take 10  mg by mouth daily., Disp: , Rfl:    ascorbic acid (VITAMIN C) 500 MG tablet, Take 500 mg by mouth daily., Disp: , Rfl:    brimonidine (ALPHAGAN) 0.2 % ophthalmic solution, Place 1 drop into both eyes in the morning and at bedtime., Disp: , Rfl:    clobetasol cream (TEMOVATE) 0.05 %, Apply 1 Application topically daily as needed (itching)., Disp: 30 g, Rfl: 3   clotrimazole (LOTRIMIN) 1 % cream, Apply 1 Application topically 2 (two) times daily. Apply for 4 weeks., Disp: 60 g, Rfl: 0   dorzolamide-timolol (COSOPT) 22.3-6.8 MG/ML ophthalmic solution, Place 1 drop into both eyes 2 (two) times daily., Disp: , Rfl:    ferrous sulfate 325 (65 FE) MG tablet, Take 325 mg by mouth daily with breakfast., Disp: , Rfl:    ibuprofen (ADVIL) 800 MG tablet, Take 1 tablet (800 mg total) by mouth every 8 (eight) hours as needed for moderate pain. For AFTER surgery only, Disp: 30 tablet, Rfl: 0   ROCKLATAN 0.02-0.005 % SOLN, Place 1 drop into both eyes at bedtime., Disp: , Rfl:   Review of Symptoms: Complete 10-system review is positive for: ***  Physical Exam: There were no vitals taken for this visit. General: ***Alert, oriented, no acute distress. HEENT: ***Normocephalic, atraumatic. Neck symmetric without masses. Sclera anicteric.  ***Posterior oropharynx clear. Chest: ***Normal work of breathing. ***Clear to auscultation bilaterally.  ***Port site clean. Cardiovascular: ***Regular rate and rhythm, no murmurs. Abdomen: ***Soft, nontender.  Normoactive bowel sounds.  No masses or hepatosplenomegaly appreciated.  ***Well-healed scar. Extremities: ***Grossly normal range of motion.  Warm, well perfused.  No edema bilaterally. Skin: ***No rashes or lesions noted. Lymphatics: ***No cervical, supraclavicular, or inguinal adenopathy. GU: Normal appearing external genitalia without erythema, excoriation, or lesions.  Speculum exam reveals ***.  Bimanual exam reveals ***.  ***Rectovaginal exam ***. Exam chaperoned by ***    Laboratory & Radiologic Studies: ***

## 2023-05-03 DIAGNOSIS — F17210 Nicotine dependence, cigarettes, uncomplicated: Secondary | ICD-10-CM | POA: Diagnosis not present

## 2023-05-03 DIAGNOSIS — I10 Essential (primary) hypertension: Secondary | ICD-10-CM | POA: Diagnosis not present

## 2023-05-03 DIAGNOSIS — Z2821 Immunization not carried out because of patient refusal: Secondary | ICD-10-CM | POA: Diagnosis not present

## 2023-05-03 DIAGNOSIS — Z0000 Encounter for general adult medical examination without abnormal findings: Secondary | ICD-10-CM | POA: Diagnosis not present

## 2023-05-03 DIAGNOSIS — Z299 Encounter for prophylactic measures, unspecified: Secondary | ICD-10-CM | POA: Diagnosis not present

## 2023-05-04 ENCOUNTER — Encounter: Payer: Self-pay | Admitting: Psychiatry

## 2023-05-04 ENCOUNTER — Telehealth: Payer: Self-pay

## 2023-05-04 ENCOUNTER — Telehealth: Payer: Self-pay | Admitting: Psychiatry

## 2023-05-04 MED ORDER — CITALOPRAM HYDROBROMIDE 10 MG PO TABS: ORAL_TABLET | ORAL | 2 refills | Status: DC

## 2023-05-04 NOTE — Telephone Encounter (Signed)
Email received from [facility] for a prior authorization on prescription sent to pharmacy by Dr. Alvester Morin for Celexa 10mg .   Authorization completed with positive outcome. Medication has been approved through [facility] until 07/11/2024  Pt is aware

## 2023-05-04 NOTE — Telephone Encounter (Signed)
Called pt about question regarding hotflashes.  Discussed that I recommend against hormonal therapy in the setting of her endometrial cancer.  Reviewed nonhormonal medication options.  Will send prescription for Celexa 10 mg daily for 1 week followed by 20 mg daily.

## 2023-05-05 LAB — SURGICAL PATHOLOGY

## 2023-05-17 ENCOUNTER — Telehealth: Payer: Self-pay | Admitting: Psychiatry

## 2023-05-17 NOTE — Telephone Encounter (Signed)
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 Reviewed treatment options including laser or imiquimod. Do not feel that surgical resection is necessary. Pt desires trial topical treatment. Discussed having pt come to clinic so I can show her the area involved to ensure that she can properly apply the cream to the affected area and provide instructions on administration. Pt amenable to this. Will have clinic call to schedule.

## 2023-05-18 ENCOUNTER — Telehealth: Payer: Self-pay | Admitting: *Deleted

## 2023-05-18 NOTE — Telephone Encounter (Signed)
Per Dr Alvester Morin patient scheduled for a follow up appt with Dr Alvester Morin after Christmas per the patient request. Dr Alvester Morin notified

## 2023-06-28 DIAGNOSIS — R6889 Other general symptoms and signs: Secondary | ICD-10-CM | POA: Diagnosis not present

## 2023-06-28 DIAGNOSIS — H04123 Dry eye syndrome of bilateral lacrimal glands: Secondary | ICD-10-CM | POA: Diagnosis not present

## 2023-06-28 DIAGNOSIS — H401133 Primary open-angle glaucoma, bilateral, severe stage: Secondary | ICD-10-CM | POA: Diagnosis not present

## 2023-06-28 DIAGNOSIS — H2513 Age-related nuclear cataract, bilateral: Secondary | ICD-10-CM | POA: Diagnosis not present

## 2023-06-30 ENCOUNTER — Telehealth: Payer: Self-pay | Admitting: *Deleted

## 2023-06-30 LAB — MOLECULAR PATHOLOGY

## 2023-06-30 NOTE — Telephone Encounter (Signed)
Spoke with [name] who called to reschedule her December 30 th appt. With Dr. Alvester Morin to a January appt. Pt is scheduled for January 20 th at 1:45, pt agreed to date and time. And had no further concerns or questions at this time.

## 2023-07-11 ENCOUNTER — Inpatient Hospital Stay: Payer: Medicare HMO | Admitting: Psychiatry

## 2023-08-01 ENCOUNTER — Ambulatory Visit: Payer: Medicare HMO | Admitting: Psychiatry

## 2023-08-01 ENCOUNTER — Inpatient Hospital Stay: Payer: Medicare HMO | Attending: Psychiatry | Admitting: Psychiatry

## 2023-08-01 ENCOUNTER — Encounter: Payer: Self-pay | Admitting: Psychiatry

## 2023-08-01 VITALS — BP 140/88 | HR 88 | Temp 98.8°F | Resp 18 | Ht 68.5 in | Wt 238.0 lb

## 2023-08-01 DIAGNOSIS — F17210 Nicotine dependence, cigarettes, uncomplicated: Secondary | ICD-10-CM | POA: Insufficient documentation

## 2023-08-01 DIAGNOSIS — Z90722 Acquired absence of ovaries, bilateral: Secondary | ICD-10-CM | POA: Diagnosis not present

## 2023-08-01 DIAGNOSIS — L280 Lichen simplex chronicus: Secondary | ICD-10-CM | POA: Diagnosis not present

## 2023-08-01 DIAGNOSIS — Z8542 Personal history of malignant neoplasm of other parts of uterus: Secondary | ICD-10-CM | POA: Diagnosis not present

## 2023-08-01 DIAGNOSIS — C541 Malignant neoplasm of endometrium: Secondary | ICD-10-CM

## 2023-08-01 DIAGNOSIS — Z90710 Acquired absence of both cervix and uterus: Secondary | ICD-10-CM | POA: Insufficient documentation

## 2023-08-01 DIAGNOSIS — L02411 Cutaneous abscess of right axilla: Secondary | ICD-10-CM | POA: Diagnosis not present

## 2023-08-01 DIAGNOSIS — L02421 Furuncle of right axilla: Secondary | ICD-10-CM | POA: Insufficient documentation

## 2023-08-01 DIAGNOSIS — C519 Malignant neoplasm of vulva, unspecified: Secondary | ICD-10-CM | POA: Diagnosis not present

## 2023-08-01 DIAGNOSIS — I10 Essential (primary) hypertension: Secondary | ICD-10-CM | POA: Diagnosis not present

## 2023-08-01 DIAGNOSIS — Z8544 Personal history of malignant neoplasm of other female genital organs: Secondary | ICD-10-CM | POA: Diagnosis not present

## 2023-08-01 DIAGNOSIS — Z9079 Acquired absence of other genital organ(s): Secondary | ICD-10-CM | POA: Diagnosis not present

## 2023-08-01 DIAGNOSIS — Z79899 Other long term (current) drug therapy: Secondary | ICD-10-CM | POA: Diagnosis not present

## 2023-08-01 DIAGNOSIS — N903 Dysplasia of vulva, unspecified: Secondary | ICD-10-CM

## 2023-08-01 MED ORDER — IMIQUIMOD 5 % EX CREA: TOPICAL_CREAM | CUTANEOUS | 3 refills | Status: DC

## 2023-08-01 NOTE — Progress Notes (Signed)
Gynecologic Oncology Return Clinic Visit  Date of Service: 08/01/2023 Referring Provider: Mickeal Skinner, NP [address] [place],  [place] [address]  Assessment & Plan: Anna Case is a 53 y.o. 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 - Recommended that pt present for follow-up to discuss treatment but pt rescheduled. - Discussed treatment with laser or topical treatment. Area is limited and would be amenable to laser but pt declines. - Instructions on application of imiquimod provided. Rx sent. - Follow-up in 8 weeks to monitor progress - Otherwise, surveillance for cancer recommended every 3-6 months for the first 2 years, then every 6-3mo for years 3-5.  Endometrial cancer: - NED on exam today - No high-intermediate risk factors. - Recommend observation. - Signs/symptoms of recurrence reviewed. - Surveillance reviewed. Follow-up q6 months x2 years then yearly.   Hotflashes: - Recommended against hormonal therapy in the setting of endometrial cancer - Previously prescribed celexa. Pt declined due to what she read about side effects. Prefers to go without medication management at this time.   Lichen simplex chronicus with fungal infection: - Continue topical anti-fungal and topical steroid cream. Pt should resume. - Reports boil under right arm - Recommend referral to derm. Pt declines at this time but will check with her PCP and accept referral from them locally possibly.   RTC 8 weeks  [name], MD Gynecologic Oncology   Medical Decision Making I personally spent  TOTAL 22 minutes  face-to-face and non-face-to-face in the care of this patient, which includes all pre, intra, and post visit time on the date of service.   ----------------------- Reason for Visit: Surveillance  Treatment History: Oncology History  Vulvar cancer (HCC)  09/20/2022 Initial Biopsy   FINAL MICROSCOPIC DIAGNOSIS:  A. VULVAR MASS, RIGHT, BIOPSY:  - Invasive well to moderately differentiated squamous cell carcinoma,  see comment  B. INTROITUS, BIOPSY:  - Low-grade squamous intraepithelial lesion (VIN1, condyloma), see  comment    09/20/2022 Initial Diagnosis   Vulvar cancer   10/13/2022 Imaging   PET: IMPRESSION: Postop changes from right vulvectomy. No evidence of residual or recurrent vulvar mass.   Intense hypermetabolic activity throughout the endometrial cavity of the uterus, highly suspicious for endometrial carcinoma.   Sub-cm bilateral inguinal lymph nodes show mild hypermetabolism. Metastatic disease cannot be excluded.   Sub-centimeter bilateral axillary lymph nodes show mild hypermetabolism, and are indeterminate.   Focal hypermetabolism in lateral right thyroid lobe. Thyroid carcinoma cannot be excluded. Recommend thyroid US and biopsy. (ref: J Am Coll Radiol. 2015 Feb;12(2): 143-50).   Sub-cm bilateral upper neck lymph nodes show mild hypermetabolism. Metastatic disease cannot be excluded, possibly from thyroid carcinoma if the hypermetabolic right thyroid nodule is malignant.   Endometrial cancer (HCC)  10/18/2022 Initial Diagnosis   Endometrial cancer   10/18/2022 Initial Biopsy   FINAL MICROSCOPIC DIAGNOSIS:  A. ENDOMETRIUM, BIOPSY:  - Endometrioid carcinoma, FIGO grade 1  - See comment  COMMENT:  Part A: Immunohistochemical staining for p53 and MMR is pending and will  be reported in an addendum.  Dr. Reynolds Bowl reviewed the case and agrees with  the above diagnosis.  [facility] EXPRESSION RESULTS  TEST           RESULT  MLH1:          Preserved nuclear expression   MSH2:          Preserved nuclear expression  MSH6:          Preserved nuclear expression  PMS2:          Preserved nuclear expression      Interval History: Patient reports that she currently has a wound to her right arm that started about a week ago but has worsened since Friday.  Kept at night last night.  Plans given emergency room after this visit.   Has had some itching on her vulva, groins. Had stopped her creams but resumed which helped some.  She otherwise denies vaginal bleeding, abdominal/pelvic pain, unintentional weight loss, change in bowel or bladder habits, early satiety, bloating, nausea/vomiting.  No new vulvar lesions otherwise that she is noted.  Decided not to take medication for hotflashes. Prefers to treat symptomatically at this time.   Past Medical/Surgical History: Past Medical History:  Diagnosis Date   Anemia    iron   Cancer (HCC)    vulva   GERD (gastroesophageal reflux disease)    Glaucoma    Hypertension    Pre-diabetes     Past Surgical History:  Procedure Laterality Date   CO2 LASER APPLICATION N/A 01/04/2023   Procedure: CO2 LASER APPLICATION TO VULVA;  Surgeon: [name], MD;  Location: [facility];  Service: Gynecology;  Laterality: N/A;   COLONOSCOPY W/ POLYPECTOMY     ROBOTIC ASSISTED TOTAL HYSTERECTOMY WITH BILATERAL SALPINGO OOPHERECTOMY N/A 01/04/2023   Procedure: XI ROBOTIC ASSISTED TOTAL HYSTERECTOMY WITH BILATERAL SALPINGO OOPHORECTOMY;  Surgeon: [name], MD;  Location: [facility];  Service: Gynecology;  Laterality: N/A;   SENTINEL NODE BIOPSY N/A 01/04/2023   Procedure: SENTINEL NODE BIOPSY;  Surgeon: [name], MD;  Location: [facility];  Service: Gynecology;  Laterality: N/A;   VULVA SURGERY      Family History  Problem Relation Age of Onset   Diabetes Mother    Heart attack Father    Prostate cancer Maternal Uncle 56       metastatic   Colon cancer Maternal Uncle    Prostate cancer Paternal Uncle 33   Breast  cancer Other        maternal first cousin once removed   Colon cancer Other        grandmother's sister x2   Prostate cancer Other        grandmother's brother   Prostate cancer Other        grandfather's brother x2   Breast cancer Other        grandfather's sister   Ovarian cancer Neg Hx    Endometrial cancer Neg Hx    Pancreatic cancer Neg Hx     Social History   Socioeconomic History   Marital status: Single    Spouse name: Not on file   Number of children: Not on file   Years of education: Not on file   Highest education level: Not on file  Occupational History   Not on file  Tobacco Use   Smoking status: Every Day    Current packs/day: 0.25    Average packs/day: 0.3 packs/day for 20.0 years (5.0 ttl pk-yrs)    Types: Cigarettes   Smokeless tobacco: Never  Vaping Use   Vaping status: Never Used  Substance and Sexual Activity  Alcohol use: No   Drug use: Not Currently    Types: Marijuana   Sexual activity: Not Currently    Partners: Male  Other Topics Concern   Not on file  Social History Narrative   Not on file   Social Drivers of Health   Financial Resource Strain: Not on file  Food Insecurity: Food Insecurity Present (01/04/2023)   Hunger Vital Sign    Worried About Running Out of Food in the Last Year: Sometimes true    Ran Out of Food in the Last Year: Sometimes true  Transportation Needs: No Transportation Needs (01/04/2023)   PRAPARE - Administrator, Civil Service (Medical): No    Lack of Transportation (Non-Medical): No  Physical Activity: Not on file  Stress: Not on file  Social Connections: Not on file    Current Medications:  Current Outpatient Medications:    amLODipine (NORVASC) 10 MG tablet, Take 10 mg by mouth daily., Disp: , Rfl:    ascorbic acid (VITAMIN C) 500 MG tablet, Take 500 mg by mouth daily., Disp: , Rfl:    brimonidine (ALPHAGAN) 0.2 % ophthalmic solution, Place 1 drop into both eyes in the morning and at  bedtime., Disp: , Rfl:    clobetasol cream (TEMOVATE) 0.05 %, Apply 1 Application topically daily as needed (itching)., Disp: 30 g, Rfl: 3   dorzolamide-timolol (COSOPT) 22.3-6.8 MG/ML ophthalmic solution, Place 1 drop into both eyes 2 (two) times daily., Disp: , Rfl:    imiquimod (ALDARA) 5 % cream, Apply topically 3 (three) times a week., Disp: 12 each, Rfl: 3   ROCKLATAN 0.02-0.005 % SOLN, Place 1 drop into both eyes at bedtime., Disp: , Rfl:    citalopram (CELEXA) 10 MG tablet, Take 1 tablet (10 mg total) by mouth daily for 7 days, THEN 2 tablets (20 mg total) daily. (Patient not taking: Reported on 07/27/2023), Disp: 60 tablet, Rfl: 2   clotrimazole (LOTRIMIN) 1 % cream, Apply 1 Application topically 2 (two) times daily. Apply for 4 weeks. (Patient not taking: Reported on 07/27/2023), Disp: 60 g, Rfl: 0   ferrous sulfate 325 (65 FE) MG tablet, Take 325 mg by mouth daily with breakfast. (Patient not taking: Reported on 07/27/2023), Disp: , Rfl:   Review of Symptoms: Complete 10-system review is positive for: none other than above  Physical Exam: BP (!) 160/97 Comment: MD notified  Pulse 88   Temp 98.8 F (37.1 C)   Resp 18   Ht 5' 8.5" (1.74 m)   Wt 238 lb (108 kg)   BMI 35.66 kg/m  General: Alert, oriented, no acute distress. HEENT: Normocephalic, atraumatic. Neck symmetric without masses. Sclera anicteric.  Chest: Normal work of breathing. Clear to auscultation bilaterally.   Cardiovascular: Regular rate and rhythm, no murmurs. Abdomen: Soft, nontender.  Normoactive bowel sounds.  No masses or hepatosplenomegaly appreciated.  Well-healed incisions. Extremities: Grossly normal range of motion.  Warm, well perfused.  No edema bilaterally. Skin: Skin under pannus and bilateral groins with thickening and darkening as prior.  Left superior groin/underside of pannus with opening from recent boil that was draining.  No ongoing fluctuance in this area.  Generalized firmness underlying her skin  changes. Lymphatics: No cervical, supraclavicular, or inguinal adenopathy. GU: External genitalia with evidence of prior posterior radical vulvectomy, well-healed.  Small subcentimeter verrucous white lesions of the posterior fourchette opposite the prior surgical site. No additional lesions noted with acetic. Area showed to patient with mirror. Speculum exam reveals normal well-healed vaginal  cuff.  Bimanual exam reveals smooth vaginal cuff, no pelvic masses or nodularity. Exam chaperoned by Kimberly [place], CMA   Laboratory & Radiologic Studies: Surgical pathology (05/02/23): A. VULVA, LEFT POSTERIOR FOURCHETTE, BIOPSY:  -  High-grade squamous intraepithelial lesion (HPV dependent)/vulvar  intraepithelial neoplasia 2 of 3 (HGSIL/VIN 2).

## 2023-08-01 NOTE — Patient Instructions (Signed)
To use the cream: Wash your hands with soap and water before and after using the medicine. Use one or two packets or actuations of the pump of cream for each dose as directed by your doctor. Apply a thin layer to the affected area of the skin just before bedtime. Rub it in gently. Allow the medicine to stay on the treated skin for 6 to 10 hours (generally overnight). Do not take a bath, swim, or get the treated area wet during this time. After the right amount of time has passed, wash the treated area with mild soap and water. Do not bandage or otherwise wrap the skin being treated, unless directed to do so by your doctor. Materials that are not airtight, such as cotton gauze or cotton underwear, may be used if needed. Throw out any unused cream from the single-dose packet.

## 2023-08-04 DIAGNOSIS — F17210 Nicotine dependence, cigarettes, uncomplicated: Secondary | ICD-10-CM | POA: Diagnosis not present

## 2023-08-04 DIAGNOSIS — Z299 Encounter for prophylactic measures, unspecified: Secondary | ICD-10-CM | POA: Diagnosis not present

## 2023-08-04 DIAGNOSIS — Z6837 Body mass index (BMI) 37.0-37.9, adult: Secondary | ICD-10-CM | POA: Diagnosis not present

## 2023-08-04 DIAGNOSIS — L02419 Cutaneous abscess of limb, unspecified: Secondary | ICD-10-CM | POA: Diagnosis not present

## 2023-08-04 DIAGNOSIS — I10 Essential (primary) hypertension: Secondary | ICD-10-CM | POA: Diagnosis not present

## 2023-08-16 ENCOUNTER — Telehealth: Payer: Self-pay | Admitting: *Deleted

## 2023-08-16 NOTE — Telephone Encounter (Signed)
Per provider spoke with the patient and moved appt from 3/17 to 3/31

## 2023-09-07 DIAGNOSIS — S9031XA Contusion of right foot, initial encounter: Secondary | ICD-10-CM | POA: Diagnosis not present

## 2023-09-07 DIAGNOSIS — F17210 Nicotine dependence, cigarettes, uncomplicated: Secondary | ICD-10-CM | POA: Diagnosis not present

## 2023-09-07 DIAGNOSIS — I10 Essential (primary) hypertension: Secondary | ICD-10-CM | POA: Diagnosis not present

## 2023-09-07 DIAGNOSIS — Z888 Allergy status to other drugs, medicaments and biological substances status: Secondary | ICD-10-CM | POA: Diagnosis not present

## 2023-09-26 ENCOUNTER — Ambulatory Visit: Payer: Medicare HMO | Admitting: Psychiatry

## 2023-10-10 ENCOUNTER — Inpatient Hospital Stay (HOSPITAL_BASED_OUTPATIENT_CLINIC_OR_DEPARTMENT_OTHER): Payer: Medicare (Managed Care) | Admitting: Gynecologic Oncology

## 2023-10-10 ENCOUNTER — Other Ambulatory Visit: Payer: Self-pay

## 2023-10-10 ENCOUNTER — Inpatient Hospital Stay: Payer: Medicare (Managed Care) | Attending: Psychiatry | Admitting: Psychiatry

## 2023-10-10 VITALS — BP 121/75 | HR 85 | Temp 98.1°F | Resp 20 | Wt 240.0 lb

## 2023-10-10 DIAGNOSIS — B372 Candidiasis of skin and nail: Secondary | ICD-10-CM | POA: Insufficient documentation

## 2023-10-10 DIAGNOSIS — N901 Moderate vulvar dysplasia: Secondary | ICD-10-CM | POA: Insufficient documentation

## 2023-10-10 DIAGNOSIS — C519 Malignant neoplasm of vulva, unspecified: Secondary | ICD-10-CM

## 2023-10-10 DIAGNOSIS — N903 Dysplasia of vulva, unspecified: Secondary | ICD-10-CM

## 2023-10-10 DIAGNOSIS — Z90710 Acquired absence of both cervix and uterus: Secondary | ICD-10-CM | POA: Diagnosis not present

## 2023-10-10 DIAGNOSIS — Z8544 Personal history of malignant neoplasm of other female genital organs: Secondary | ICD-10-CM | POA: Insufficient documentation

## 2023-10-10 DIAGNOSIS — Z9079 Acquired absence of other genital organ(s): Secondary | ICD-10-CM | POA: Insufficient documentation

## 2023-10-10 DIAGNOSIS — C541 Malignant neoplasm of endometrium: Secondary | ICD-10-CM

## 2023-10-10 DIAGNOSIS — Z8542 Personal history of malignant neoplasm of other parts of uterus: Secondary | ICD-10-CM | POA: Diagnosis not present

## 2023-10-10 DIAGNOSIS — Z90722 Acquired absence of ovaries, bilateral: Secondary | ICD-10-CM | POA: Insufficient documentation

## 2023-10-10 DIAGNOSIS — L280 Lichen simplex chronicus: Secondary | ICD-10-CM | POA: Diagnosis not present

## 2023-10-10 NOTE — Patient Instructions (Addendum)
 It was a pleasure to see you in clinic today. - We discussed proceeding with a laser ablation of the vulva. We are planning on surgery on Nov 22, 2023.  Thank you very much for allowing me to provide care for you today.  I appreciate your confidence in choosing our Gynecologic Oncology team at [facility].  If you have any questions about your visit today please call our office or send [place] a MyChart message and we will get back to you as soon as possible.   Preparing for your Surgery  Plan for surgery on Nov 22, 2023 with Dr. [name] at [facility]. You will be scheduled for carbon dioxide laser application to the vulva.   Dr. Alvester Morin is recommending referral to dermatology so we will place this referral for you. You should be contacted with the appointment.  Pre-operative Testing -You will receive a phone call from presurgical testing at [facility] to discuss surgery instructions and arrange for lab work if needed.  -Bring your insurance card, copy of an advanced directive if applicable, medication list.  -You should not be taking blood thinners or aspirin at least ten days prior to surgery unless instructed by your surgeon.  -Do not take supplements such as fish oil (omega 3), red yeast rice, turmeric before your surgery. You want to avoid medications with aspirin in them including headache powders such as BC or Goody's), Excedrin migraine.  Day Before Surgery at Home -You will be advised you can have clear liquids up until 3 hours before your surgery.    Your role in recovery Your role is to become active as soon as directed by your doctor, while still giving yourself time to heal.  Rest when you feel tired. You will be asked to do the following in order to speed your recovery:  - Cough and breathe deeply. This helps to clear and expand your lungs and can prevent pneumonia after surgery.  - STAY ACTIVE WHEN YOU GET HOME. Do mild physical activity. Walking or  moving your legs help your circulation and body functions return to normal. Do not try to get up or walk alone the first time after surgery.   -If you develop swelling on one leg or the other, pain in the back of your leg, redness/warmth in one of your legs, please call the office or go to the Emergency Room to have a doppler to rule out a blood clot. For shortness of breath, chest pain-seek care in the Emergency Room as soon as possible. - Actively manage your pain. Managing your pain lets you move in comfort. We will ask you to rate your pain on a scale of zero to 10. It is your responsibility to tell your doctor or nurse where and how much you hurt so your pain can be treated.  Special Considerations -Your final pathology results from surgery should be available around one week after surgery and the results will be relayed to you when available.  -FMLA forms can be faxed to [phone] and please allow 5-7 business days for completion.  Pain Management After Surgery -You will be prescribed your pain medication and bowel regimen medications before surgery closer to the date so that you can have these available when you are discharged from the hospital. The pain medication is for use ONLY AFTER surgery and a new prescription will not be given.   -Make sure that you have Tylenol and Ibuprofen at home IF YOU ARE ABLE TO  TAKE THESE MEDICATIONS to use on a regular basis after surgery for pain control. We recommend alternating the medications every hour to six hours since they work differently and are processed in the body differently for pain relief.  -Review the attached handout on narcotic use and their risks and side effects.   Bowel Regimen -You will be prescribed Sennakot-S to take nightly to prevent constipation especially if you are taking the narcotic pain medication intermittently.  It is important to prevent constipation and drink adequate amounts of liquids. You can stop taking this  medication when you are not taking pain medication and you are back on your normal bowel routine.  Risks of Surgery Risks of surgery are low but include bleeding, infection, damage to surrounding structures, re-operation, blood clots, and very rarely death.  AFTER SURGERY INSTRUCTIONS  Return to work:  variable based on occupation  We recommend purchasing several bags of frozen green peas and dividing them into ziploc bags. You will want to keep these in the freezer and have them ready to use as ice packs to the vulvar incision. Once the ice pack is no longer cold, you can get another from the freezer. The frozen peas mold to your body better than a regular ice pack.   You have been given topical lidocaine gel to use after surgery as needed for discomfort. You can use this up to three times daily. You may also be given a topical burn cream called silvadene to apply to the lasered areas.   Activity: 1. Be up and out of the bed during the day.  Take a nap if needed.  You may walk up steps but be careful and use the hand rail.  Stair climbing will tire you more than you think, you may need to stop part way and rest.   2. No lifting or straining for 2 weeks over 10 pounds. No pushing, pulling, straining for 2 weeks.  3. No driving for minimum 24 hours after surgery with anesthesia but this is usually longer.  Do not drive if you are taking narcotic pain medicine and make sure that your reaction time has returned.   4. You can shower as soon as the next day after surgery. Shower daily. No tub baths or submerging your body in water until cleared by your surgeon. If you have the soap that was given to you by pre-surgical testing that was used before surgery, you do not need to use it afterwards because this can irritate your incisions.   5. No sexual activity and nothing in the vagina for 4-6 weeks (until seen in the office).  6. You may experience vaginal spotting and discharge after surgery.  The  spotting is normal but if you experience heavy bleeding, call our office.  7. Take Tylenol or ibuprofen first for pain if you are able to take these medications and only use narcotic pain medication for severe pain not relieved by the Tylenol or Ibuprofen.  Monitor your Tylenol intake to a max of 4,000 mg in a 24 hour period. You can alternate these medications after surgery.  Diet: 1. Low sodium Heart Healthy Diet is recommended but you are cleared to resume your normal (before surgery) diet after your procedure.  2. It is safe to use a laxative, such as Miralax or Colace, if you have difficulty moving your bowels. You have been prescribed Sennakot at bedtime every evening to keep bowel movements regular and to prevent constipation.    Wound Care:  1. Keep clean and dry.  Shower daily.  Reasons to call the Doctor: Fever - Oral temperature greater than 100.4 degrees Fahrenheit Foul-smelling vaginal discharge Difficulty urinating Nausea and vomiting Increased pain at the site of the incision that is unrelieved with pain medicine. Difficulty breathing with or without chest pain New calf pain especially if only on one side Sudden, continuing increased vaginal bleeding with or without clots.   Contacts: For questions or concerns you should contact:  Dr. [name] at [phone]  Warner Mccreedy, NP at [phone]  After Hours: call [phone] and have the GYN Oncologist paged/contacted (after 5 pm or on the weekends).  Messages sent via mychart are for non-urgent matters and are not responded to after hours so for urgent needs, please call the after hours number.

## 2023-10-10 NOTE — Progress Notes (Signed)
 Gynecologic Oncology Return Clinic Visit  Date of Service: 10/10/2023 Referring Provider: Terresa Ferry, NP [address] [place],  [place] [address]  Assessment & Plan: Anna Case is a 53 y.o. 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   Vulvar cancer: - Few small areas of persistent dysplasia of the posterior fourchette previously biopsied and demonstrated VIN2. - Previously prescribed imiquimod but pt only used for 1 week. - Given history of vulvar cancer, recommend definitive treatment.  Again reviewed options for continuation of imiquimod, treatment with laser, or resection.  Patient would like to avoid resection.  Given that she did not tolerate topical treatment, I recommend laser treatment.  Patient is willing to proceed. - Otherwise, surveillance for cancer recommended every 3-6 months for the first 2 years, then every 6-33mo for years 3-5.  Patient was consented for: CO2 laser ablation of the vulva on 11/22/23.  The risks of surgery were discussed in detail and she understands these to including but not limited to bleeding requiring a blood transfusion, infection, injury to adjacent organs (including but not limited to the vagina, urethra, rectum, nearby blood vessels and nerves).  If the patient experiences any of these events, she understands that her hospitalization or recovery may be prolonged and that she may need to take additional medications for a prolonged period. The patient will receive DVT and antibiotic prophylaxis as indicated. She voiced a clear understanding. She had the opportunity to ask questions and informed consent was obtained today. She wishes to proceed.  She does not require preoperative clearance. Her  METs are >4.  All preoperative instructions were reviewed. Postoperative expectations were also reviewed. Written handouts were provided to the patient.   Endometrial cancer: - NED on exam today - No high-intermediate risk factors. - Recommend observation. - Signs/symptoms of recurrence reviewed. - Surveillance reviewed. Follow-up q6 months x2 years then yearly.   Hotflashes: - Recommended against hormonal therapy in the setting of endometrial cancer - Pt still bothered by hotflashes but declines medication management.  Lichen simplex chronicus with fungal infection: - Continue topical anti-fungal and topical steroid cream in groins. Pt should resume. - Strongly recommend referral to dermatology. Pt has not discussed with PCP. Will go ahead and refer.   RTC postop  Derrel Flies, MD Gynecologic Oncology   Medical Decision Making I personally spent  TOTAL 30 minutes face-to-face and non-face-to-face in the care of this patient, which includes all pre, intra, and post visit time on the date of service.   ----------------------- Reason for Visit: Surveillance  Treatment History: Oncology History  Vulvar cancer (HCC)  09/20/2022 Initial Biopsy   FINAL MICROSCOPIC DIAGNOSIS:  A. VULVAR MASS, RIGHT, BIOPSY:  - Invasive well to moderately differentiated squamous cell carcinoma,  see comment  B. INTROITUS, BIOPSY:  - Low-grade squamous intraepithelial lesion (VIN1, condyloma), see  comment    09/20/2022 Initial Diagnosis   Vulvar cancer   10/13/2022 Imaging   PET: IMPRESSION: Postop changes from right vulvectomy. No evidence of residual or recurrent vulvar mass.   Intense hypermetabolic activity throughout the endometrial cavity of the uterus, highly suspicious for endometrial carcinoma.   Sub-cm bilateral inguinal lymph nodes show mild hypermetabolism. Metastatic disease cannot be excluded.   Sub-centimeter bilateral axillary lymph  nodes show mild hypermetabolism,  and are indeterminate.   Focal hypermetabolism in lateral right thyroid lobe. Thyroid carcinoma cannot be excluded. Recommend thyroid US  and biopsy. (ref: J Am Coll Radiol. 2015 Feb;12(2): 143-50).   Sub-cm bilateral upper neck lymph nodes show mild hypermetabolism. Metastatic disease cannot be excluded, possibly from thyroid carcinoma if the hypermetabolic right thyroid nodule is malignant.   Endometrial cancer (HCC)  10/18/2022 Initial Diagnosis   Endometrial cancer   10/18/2022 Initial Biopsy   FINAL MICROSCOPIC DIAGNOSIS:  A. ENDOMETRIUM, BIOPSY:  - Endometrioid carcinoma, FIGO grade 1  - See comment  COMMENT:  Part A: Immunohistochemical staining for p53 and MMR is pending and will  be reported in an addendum.  Dr. Talmadge Fail reviewed the case and agrees with  the above diagnosis.  IHC EXPRESSION RESULTS  TEST           RESULT  MLH1:          Preserved nuclear expression  MSH2:          Preserved nuclear expression  MSH6:          Preserved nuclear expression  PMS2:          Preserved nuclear expression      Interval History: Patient reports that she only used 1 week of the imiquimod.  She stopped due to burning but did not notify us .  Has not been doing anything for the past 2 months.  Otherwise denies any new vaginal bleeding, abdominal/pelvic pain, unintentional weight loss, change in bowel or bladder habits, early satiety, bloating, nausea/vomiting.   In her groins, she has used cream at times for the yeast.  She has not asked her PCP about a dermatologist.  Decided not to take medication for hot flashes.  Prefers to treat symptomatically at this time.   Past Medical/Surgical History: Past Medical History:  Diagnosis Date   Anemia    iron   Cancer (HCC)    vulva   GERD (gastroesophageal reflux disease)    Glaucoma    Hypertension    Pre-diabetes     Past Surgical History:  Procedure Laterality Date   CO2 LASER APPLICATION N/A 01/04/2023   Procedure: CO2 LASER  APPLICATION TO VULVA;  Surgeon: Derrel Flies, MD;  Location: [facility];  Service: Gynecology;  Laterality: N/A;   COLONOSCOPY W/ POLYPECTOMY     ROBOTIC ASSISTED TOTAL HYSTERECTOMY WITH BILATERAL SALPINGO OOPHERECTOMY N/A 01/04/2023   Procedure: XI ROBOTIC ASSISTED TOTAL HYSTERECTOMY WITH BILATERAL SALPINGO OOPHORECTOMY;  Surgeon: Derrel Flies, MD;  Location: [facility];  Service: Gynecology;  Laterality: N/A;   SENTINEL NODE BIOPSY N/A 01/04/2023   Procedure: SENTINEL NODE BIOPSY;  Surgeon: Derrel Flies, MD;  Location: [facility];  Service: Gynecology;  Laterality: N/A;   VULVA SURGERY      Family History  Problem Relation Age of Onset   Diabetes Mother    Heart attack Father    Prostate cancer Maternal Uncle 1       metastatic   Colon cancer Maternal Uncle    Prostate cancer Paternal Uncle 27   Breast cancer Other        maternal first cousin once removed   Colon cancer Other        grandmother's sister x2   Prostate cancer Other        grandmother's brother   Prostate cancer Other        grandfather's brother x2   Breast cancer Other  grandfather's sister   Ovarian cancer Neg Hx    Endometrial cancer Neg Hx    Pancreatic cancer Neg Hx     Social History   Socioeconomic History   Marital status: Single    Spouse name: Not on file   Number of children: Not on file   Years of education: Not on file   Highest education level: Not on file  Occupational History   Not on file  Tobacco Use   Smoking status: Every Day    Current packs/day: 0.25    Average packs/day: 0.3 packs/day for 20.0 years (5.0 ttl pk-yrs)    Types: Cigarettes   Smokeless tobacco: Never  Vaping Use   Vaping status: Never Used  Substance and Sexual Activity   Alcohol use: No   Drug use: Not Currently    Types: Marijuana   Sexual activity: Not Currently    Partners: Male  Other Topics Concern   Not on file  Social History Narrative   Not on file   Social Drivers of Health   Financial  Resource Strain: Not on file  Food Insecurity: Food Insecurity Present (01/04/2023)   Hunger Vital Sign    Worried About Running Out of Food in the Last Year: Sometimes true    Ran Out of Food in the Last Year: Sometimes true  Transportation Needs: No Transportation Needs (01/04/2023)   PRAPARE - Administrator, Civil Service (Medical): No    Lack of Transportation (Non-Medical): No  Physical Activity: Not on file  Stress: Not on file  Social Connections: Not on file    Current Medications:  Current Outpatient Medications:    amLODipine (NORVASC) 10 MG tablet, Take 10 mg by mouth daily., Disp: , Rfl:    ascorbic acid (VITAMIN C) 500 MG tablet, Take 500 mg by mouth daily., Disp: , Rfl:    brimonidine (ALPHAGAN) 0.2 % ophthalmic solution, Place 1 drop into both eyes in the morning and at bedtime., Disp: , Rfl:    clobetasol cream (TEMOVATE) 0.05 %, Apply 1 Application topically daily as needed (itching)., Disp: 30 g, Rfl: 3   ferrous sulfate 325 (65 FE) MG tablet, Take 325 mg by mouth daily with breakfast., Disp: , Rfl:    ROCKLATAN 0.02-0.005 % SOLN, Place 1 drop into both eyes at bedtime., Disp: , Rfl:   Review of Symptoms: Complete 10-system review is positive for: none other than above  Physical Exam: BP 121/75 (BP Location: Left Arm, Patient Position: Sitting)   Pulse 85   Temp 98.1 F (36.7 C) (Oral)   Resp 20   Wt 240 lb (108.9 kg)   SpO2 100%   BMI 35.96 kg/m  General: Alert, oriented, no acute distress. HEENT: Normocephalic, atraumatic. Neck symmetric without masses. Sclera anicteric.  Chest: Normal work of breathing. Clear to auscultation bilaterally.   Cardiovascular: Regular rate and rhythm, no murmurs. Abdomen: Soft, nontender.  Normoactive bowel sounds.  No masses appreciated.  Well-healed incisions. Extremities: Grossly normal range of motion.  Warm, well perfused.  No edema bilaterally. Skin: Skin under pannus and bilateral groins with thickening and  darkening as prior. Moisture and superficial breakdown in midline under pannus. Generalized firmness underlying her skin changes. Lymphatics: No cervical, supraclavicular, or inguinal adenopathy. GU: External genitalia with evidence of prior posterior radical vulvectomy, well-healed.  Persistent small subcentimeter verrucous white lesions of the posterior fourchette opposite the prior surgical site. Speculum exam reveals normal vaginal cuff.  Bimanual exam reveals smooth vaginal cuff, no  pelvic masses or nodularity. Exam chaperoned by Kimberly [place]az[place], CMA   VULVAR COLPOSCOPY PROCEDURE NOTE  Procedure Details: After appropriate verbal informed consent was obtained, a timeout was performed. Acetic acid was applied to the vulva and the vulva was inspected with the colposcope with the findings as noted below. The vulva was then cleansed of the acetic acid with water. The patient tolerated the procedure well.   Adequate Exam: yes  Biopsy Specimen: none  Condition: Stable. Patient tolerated procedure well.  Complications: None  Findings: Acetowhite changes of posterior fourchette and two small 3-90mm areas on left and right inferior medial labia majora   Colposcopic Impression: VIN2    Laboratory & Radiologic Studies:  none

## 2023-10-11 ENCOUNTER — Telehealth: Payer: Self-pay

## 2023-10-11 NOTE — Telephone Encounter (Signed)
-----   Message from Doylene Bode sent at 10/11/2023 10:51 AM EDT ----- Dr. Alvester Morin is wanting the patient to have a referral to dermatology. You can start with [facility]/fax referral to on [address] [place], [place], [place] [address] Phone: [phone] (not fax).  Reason for referral:  Lichen simplex chronicus with fungal infection, hx vulvar ca

## 2023-10-11 NOTE — Telephone Encounter (Signed)
 Per Warner Mccreedy NP, once MD note is signed,records will be faxed to [facility] [address] [place], [place]: [phone], fax# [phone]

## 2023-10-11 NOTE — Progress Notes (Signed)
 Patient here for follow up with Dr. Alvester Morin and for a pre-operative appointment prior to her scheduled surgery on 11/22/2023. She is scheduled for vulvar laser. The surgery was discussed in detail.  See after visit summary for additional details.     Discussed post-op pain management in detail including the aspects of the enhanced recovery pathway. We discussed the use of tylenol post-op and to monitor for a maximum of 4,000 mg in a 24 hour period. Discussed bowel regimen in detail.     Discussed measures to take at home to prevent DVT including frequent mobility.  Reportable signs and symptoms of DVT discussed. Post-operative instructions discussed and expectations for after surgery. Vulvar care discussed as well including reportable signs and symptoms including erythema, drainage.    10 minutes spent preparing information and with the patient.  Verbalizing understanding of material discussed. No needs or concerns voiced at the end of the visit.   Advised patient to call for any needs.    This appointment is included in the global surgical bundle as pre-operative teaching and has no charge.

## 2023-10-25 ENCOUNTER — Telehealth: Payer: Self-pay

## 2023-10-25 ENCOUNTER — Telehealth: Payer: Self-pay | Admitting: *Deleted

## 2023-10-25 ENCOUNTER — Encounter: Payer: Self-pay | Admitting: Psychiatry

## 2023-10-25 NOTE — Telephone Encounter (Signed)
 Opened in error

## 2023-10-25 NOTE — Telephone Encounter (Signed)
 Per Dr.Newton request, referral faxed to [facility] for lichen simplex chronicus with fungal infection.

## 2023-10-25 NOTE — Telephone Encounter (Signed)
 Notification from [facility] regarding recent referral from Dr.Newton, office is no longer taking patients with medicaid as primary or secondary insurance.   New referral faxed to [facility], per Vira Grieves NP request.

## 2023-10-25 NOTE — Telephone Encounter (Signed)
 Spoke with patient who called through her pre-admit appt. With nurse. Pt states she has questions about her procedure with Dr. Daisey Dryer on Nov 22, 2023. Pt wants to know how much cutting Dr. Daisey Dryer will be doing? Pt advised that Dr. Daisey Dryer is doing CO2 laser ablation of the Vulva only. If patient has more questions, advised that her message would be relayed to Dr. Daisey Dryer. Pt thanked the office.

## 2023-11-01 ENCOUNTER — Telehealth: Payer: Self-pay

## 2023-11-01 NOTE — Telephone Encounter (Signed)
 I spoke to Mo, at [facility] regarding the referral sent to them from Dr.Newton for Lichen Simplex Chronicus. Mo states they received the referral, pt has not been scheduled yet. The referral coordinator will call the patient and schedule.

## 2023-11-08 NOTE — Telephone Encounter (Signed)
 I spoke to [facility] to check on status of referral. The referral is in the workque. Due to high volume of referrals,they are not sure when she will be called to schedule. I will continue to follow

## 2023-11-09 DIAGNOSIS — H2513 Age-related nuclear cataract, bilateral: Secondary | ICD-10-CM | POA: Diagnosis not present

## 2023-11-09 DIAGNOSIS — H04123 Dry eye syndrome of bilateral lacrimal glands: Secondary | ICD-10-CM | POA: Diagnosis not present

## 2023-11-09 DIAGNOSIS — H401133 Primary open-angle glaucoma, bilateral, severe stage: Secondary | ICD-10-CM | POA: Diagnosis not present

## 2023-11-09 DIAGNOSIS — Z7689 Persons encountering health services in other specified circumstances: Secondary | ICD-10-CM | POA: Diagnosis not present

## 2023-11-10 NOTE — Progress Notes (Signed)
 COVID Vaccine Completed:  Date of COVID positive in last 90 days:  PCP - Christean Courts, NP Cardiologist -   Chest x-ray -  EKG - 12/22/22 Epic Stress Test -  ECHO -  Cardiac Cath -  Pacemaker/ICD device last checked: Spinal Cord Stimulator:  Bowel Prep -   Sleep Study -  CPAP -   Fasting Blood Sugar -  Checks Blood Sugar _____ times a day  Last dose of GLP1 agonist-  N/A GLP1 instructions:  Hold 7 days before surgery    Last dose of SGLT-2 inhibitors-  N/A SGLT-2 instructions:  Hold 3 days before surgery    Blood Thinner Instructions:  Last dose:   Time: Aspirin Instructions: Last Dose:  Activity level:  Can go up a flight of stairs and perform activities of daily living without stopping and without symptoms of chest pain or shortness of breath.  Able to exercise without symptoms  Unable to go up a flight of stairs without symptoms of     Anesthesia review:   Patient denies shortness of breath, fever, cough and chest pain at PAT appointment  Patient verbalized understanding of instructions that were given to them at the PAT appointment. Patient was also instructed that they will need to review over the PAT instructions again at home before surgery.

## 2023-11-10 NOTE — Patient Instructions (Signed)
 SURGICAL WAITING ROOM VISITATION  Patients having surgery or a procedure may have no more than 2 support people in the waiting area - these visitors may rotate.    Children under the age of 26 must have an adult with them who is not the patient.  Due to an increase in RSV and influenza rates and associated hospitalizations, children ages 35 and under may not visit patients in [facility] hospitals.  Visitors with respiratory illnesses are discouraged from visiting and should remain at home.  If the patient needs to stay at the hospital during part of their recovery, the visitor guidelines for inpatient rooms apply. Pre-op nurse will coordinate an appropriate time for 1 support person to accompany patient in pre-op.  This support person may not rotate.    Please refer to the [facility] website for the visitor guidelines for Inpatients (after your surgery is over and you are in a regular room).    Your procedure is scheduled on: 11/22/23   Report to [facility] Main Entrance    Report to admitting at 5:15 AM   Call this number if you have problems the morning of surgery [phone]   Do not eat food :After Midnight.   After Midnight you may have the following liquids until 4:15 AM DAY OF SURGERY  Water  Non-Citrus Juices (without pulp, NO RED-Apple, White grape, White cranberry) Black Coffee (NO MILK/CREAM OR CREAMERS, sugar ok)  Clear Tea (NO MILK/CREAM OR CREAMERS, sugar ok) regular and decaf                             Plain Jell-O (NO RED)                                           Fruit ices (not with fruit pulp, NO RED)                                     Popsicles (NO RED)                                                               Sports drinks like Gatorade (NO RED)          If you have questions, please contact your surgeon's office.   FOLLOW BOWEL PREP AND ANY ADDITIONAL PRE OP INSTRUCTIONS YOU RECEIVED FROM YOUR SURGEON'S OFFICE!!!     Oral Hygiene is also  important to reduce your risk of infection.                                    Remember - BRUSH YOUR TEETH THE MORNING OF SURGERY WITH YOUR REGULAR TOOTHPASTE  DENTURES WILL BE REMOVED PRIOR TO SURGERY PLEASE DO NOT APPLY "Poly grip" OR ADHESIVES!!!   Do NOT smoke after Midnight   Stop all vitamins and herbal supplements 7 days before surgery.   Take these medicines the morning of surgery with A SIP OF WATER : None   DO NOT TAKE  ANY ORAL DIABETIC MEDICATIONS DAY OF YOUR SURGERY  Bring CPAP mask and tubing day of surgery.                              You may not have any metal on your body including hair pins, jewelry, and body piercing             Do not wear make-up, lotions, powders, perfumes, or deodorant  Do not wear nail polish including gel and S&S, artificial/acrylic nails, or any other type of covering on natural nails including finger and toenails. If you have artificial nails, gel coating, etc. that needs to be removed by a nail salon please have this removed prior to surgery or surgery may need to be canceled/ delayed if the surgeon/ anesthesia feels like they are unable to be safely monitored.   Do not shave  48 hours prior to surgery.    Do not bring valuables to the hospital. [place] IS NOT             RESPONSIBLE   FOR VALUABLES.   Contacts, glasses, dentures or bridgework may not be worn into surgery.  DO NOT BRING YOUR HOME MEDICATIONS TO THE HOSPITAL. PHARMACY WILL DISPENSE MEDICATIONS LISTED ON YOUR MEDICATION LIST TO YOU DURING YOUR ADMISSION IN THE HOSPITAL!    Patients discharged on the day of surgery will not be allowed to drive home.  Someone NEEDS to stay with you for the first 24 hours after anesthesia.   Special Instructions: Bring a copy of your healthcare power of attorney and living will documents the day of surgery if you haven't scanned them before.              Please read over the following fact sheets you were given: IF YOU HAVE QUESTIONS ABOUT  YOUR PRE-OP INSTRUCTIONS PLEASE CALL [phone] Kayleen Party    If you received a COVID test during your pre-op visit  it is requested that you wear a mask when out in public, stay away from anyone that may not be feeling well and notify your surgeon if you develop symptoms. If you test positive for Covid or have been in contact with anyone that has tested positive in the last 10 days please notify you surgeon.    [facility] - Preparing for Surgery Before surgery, you can play an important role.  Because skin is not sterile, your skin needs to be as free of germs as possible.  You can reduce the number of germs on your skin by washing with CHG (chlorahexidine gluconate) soap before surgery.  CHG is an antiseptic cleaner which kills germs and bonds with the skin to continue killing germs even after washing. Please DO NOT use if you have an allergy to CHG or antibacterial soaps.  If your skin becomes reddened/irritated stop using the CHG and inform your nurse when you arrive at Short Stay. Do not shave (including legs and underarms) for at least 48 hours prior to the first CHG shower.  You may shave your face/neck.  Please follow these instructions carefully:  1.  Shower with CHG Soap the night before surgery and the  morning of surgery.  2.  If you choose to wash your hair, wash your hair first as usual with your normal  shampoo.  3.  After you shampoo, rinse your hair and body thoroughly to remove the shampoo.  4.  Use CHG as you would any other liquid soap.  You can apply chg directly to the skin and wash.  Gently with a scrungie or clean washcloth.  5.  Apply the CHG Soap to your body ONLY FROM THE NECK DOWN.   Do   not use on face/ open                           Wound or open sores. Avoid contact with eyes, ears mouth and   genitals (private parts).                       Wash face,  Genitals (private parts) with your normal soap.             6.  Wash thoroughly, paying  special attention to the area where your    surgery  will be performed.  7.  Thoroughly rinse your body with warm water  from the neck down.  8.  DO NOT shower/wash with your normal soap after using and rinsing off the CHG Soap.                9.  Pat yourself dry with a clean towel.            10.  Wear clean pajamas.            11.  Place clean sheets on your bed the night of your first shower and do not  sleep with pets. Day of Surgery : Do not apply any lotions/deodorants the morning of surgery.  Please wear clean clothes to the hospital/surgery center.  FAILURE TO FOLLOW THESE INSTRUCTIONS MAY RESULT IN THE CANCELLATION OF YOUR SURGERY  PATIENT SIGNATURE_________________________________  NURSE SIGNATURE__________________________________  ________________________________________________________________________

## 2023-11-14 ENCOUNTER — Encounter (HOSPITAL_COMMUNITY)
Admission: RE | Admit: 2023-11-14 | Discharge: 2023-11-14 | Disposition: A | Discharge status: Home / Self Care | Payer: Medicare (Managed Care) | Source: Ambulatory Visit | Attending: Psychiatry | Admitting: Psychiatry

## 2023-11-14 ENCOUNTER — Encounter (HOSPITAL_COMMUNITY): Payer: Self-pay

## 2023-11-14 ENCOUNTER — Other Ambulatory Visit: Payer: Self-pay

## 2023-11-14 VITALS — HR 73 | Temp 97.9°F | Ht 68.5 in | Wt 239.0 lb

## 2023-11-14 DIAGNOSIS — Z01812 Encounter for preprocedural laboratory examination: Secondary | ICD-10-CM | POA: Insufficient documentation

## 2023-11-14 DIAGNOSIS — I10 Essential (primary) hypertension: Secondary | ICD-10-CM | POA: Insufficient documentation

## 2023-11-14 LAB — CBC
HCT: 39.6 % (ref 36.0–46.0)
Hemoglobin: 12.4 g/dL (ref 12.0–15.0)
MCH: 27.3 pg (ref 26.0–34.0)
MCHC: 31.3 g/dL (ref 30.0–36.0)
MCV: 87 fL (ref 80.0–100.0)
Platelets: 302 10*3/uL (ref 150–400)
RBC: 4.55 MIL/uL (ref 3.87–5.11)
RDW: 14.2 % (ref 11.5–15.5)
WBC: 6.6 10*3/uL (ref 4.0–10.5)
nRBC: 0 % (ref 0.0–0.2)

## 2023-11-14 LAB — BASIC METABOLIC PANEL WITH GFR
Anion gap: 9 (ref 5–15)
BUN: 11 mg/dL (ref 6–20)
CO2: 23 mmol/L (ref 22–32)
Calcium: 9 mg/dL (ref 8.9–10.3)
Chloride: 100 mmol/L (ref 98–111)
Creatinine, Ser: 0.85 mg/dL (ref 0.44–1.00)
GFR, Estimated: >60 mL/min (ref >60)
Glucose, Bld: 339 mg/dL — ABNORMAL HIGH (ref 70–99)
Potassium: 4 mmol/L (ref 3.5–5.1)
Sodium: 132 mmol/L — ABNORMAL LOW (ref 135–145)

## 2023-11-15 ENCOUNTER — Encounter: Payer: Medicare (Managed Care) | Admitting: Gynecologic Oncology

## 2023-11-21 ENCOUNTER — Telehealth: Payer: Self-pay | Admitting: *Deleted

## 2023-11-21 NOTE — Discharge Instructions (Signed)
 AFTER SURGERY INSTRUCTIONS   Return to work:  variable based on occupation   We recommend purchasing several bags of frozen green peas and dividing them into ziploc bags. You will want to keep these in the freezer and have them ready to use as ice packs to the vulvar incision. Once the ice pack is no longer cold, you can get another from the freezer. The frozen peas mold to your body better than a regular ice pack.    You have been given topical lidocaine  gel to use after surgery as needed for discomfort. You can use this up to three times daily. You may also be given a topical burn cream called silvadene  to apply to the lasered areas.    Activity: 1. Be up and out of the bed during the day.  Take a nap if needed.  You may walk up steps but be careful and use the hand rail.  Stair climbing will tire you more than you think, you may need to stop part way and rest.    2. No lifting or straining for 2 weeks over 10 pounds. No pushing, pulling, straining for 2 weeks.   3. No driving for minimum 24 hours after surgery with anesthesia but this is usually longer.  Do not drive if you are taking narcotic pain medicine and make sure that your reaction time has returned.    4. You can shower as soon as the next day after surgery. Shower daily. No tub baths or submerging your body in water  until cleared by your surgeon. If you have the soap that was given to you by pre-surgical testing that was used before surgery, you do not need to use it afterwards because this can irritate your incisions.    5. No sexual activity and nothing in the vagina for 4-6 weeks (until seen in the office).   6. You may experience vaginal spotting and discharge after surgery.  The spotting is normal but if you experience heavy bleeding, call our office.   7. Take Tylenol  or ibuprofen  first for pain if you are able to take these medications and only use narcotic pain medication for severe pain not relieved by the Tylenol  or  Ibuprofen .  Monitor your Tylenol  intake to a max of 4,000 mg in a 24 hour period. You can alternate these medications after surgery.   Diet: 1. Low sodium Heart Healthy Diet is recommended but you are cleared to resume your normal (before surgery) diet after your procedure.   2. It is safe to use a laxative, such as Miralax or Colace, if you have difficulty moving your bowels. You have been prescribed Sennakot at bedtime every evening to keep bowel movements regular and to prevent constipation.     Wound Care: 1. Keep clean and dry.  Shower daily.   Reasons to call the Doctor: Fever - Oral temperature greater than 100.4 degrees Fahrenheit Foul-smelling vaginal discharge Difficulty urinating Nausea and vomiting Increased pain at the site of the incision that is unrelieved with pain medicine. Difficulty breathing with or without chest pain New calf pain especially if only on one side Sudden, continuing increased vaginal bleeding with or without clots.   Contacts: For questions or concerns you should contact:   Dr. Derrel Flies at [phone]   Vira Grieves, NP at [phone]   After Hours: call [phone] and have the GYN Oncologist paged/contacted (after 5 pm or on the weekends).   Messages sent via mychart are for non-urgent matters and  are not responded to after hours so for urgent needs, please call the after hours number.

## 2023-11-21 NOTE — Telephone Encounter (Signed)
 Telephone call to check on pre-operative status.  Patient compliant with pre-operative instructions.  Reinforced nothing to eat after midnight. Clear liquids until 0415. Patient to arrive at 0515.  No questions or concerns voiced.  Instructed to call for any needs.

## 2023-11-21 NOTE — Telephone Encounter (Signed)
 11/21/23:Update on referral by Dr.Newton.  Pt is scheduled with Karena Paci, at [facility] on 02/27/24 for Lichen Simplex Chronicus, hx of vulvar cancer

## 2023-11-22 ENCOUNTER — Ambulatory Visit (HOSPITAL_COMMUNITY): Payer: Medicare (Managed Care) | Admitting: Physician Assistant

## 2023-11-22 ENCOUNTER — Other Ambulatory Visit: Payer: Self-pay

## 2023-11-22 ENCOUNTER — Encounter (HOSPITAL_COMMUNITY): Payer: Self-pay | Admitting: Psychiatry

## 2023-11-22 ENCOUNTER — Encounter (HOSPITAL_COMMUNITY)
Admission: RE | Disposition: A | Discharge status: Home / Self Care | Payer: Self-pay | Source: Ambulatory Visit | Attending: Psychiatry

## 2023-11-22 ENCOUNTER — Ambulatory Visit (HOSPITAL_BASED_OUTPATIENT_CLINIC_OR_DEPARTMENT_OTHER): Payer: Medicare (Managed Care) | Admitting: Anesthesiology

## 2023-11-22 ENCOUNTER — Ambulatory Visit (HOSPITAL_COMMUNITY)
Admission: RE | Admit: 2023-11-22 | Discharge: 2023-11-22 | Disposition: A | Discharge status: Home / Self Care | Payer: Medicare (Managed Care) | Source: Ambulatory Visit | Attending: Psychiatry | Admitting: Psychiatry

## 2023-11-22 DIAGNOSIS — E66813 Obesity, class 3: Secondary | ICD-10-CM | POA: Diagnosis not present

## 2023-11-22 DIAGNOSIS — F17210 Nicotine dependence, cigarettes, uncomplicated: Secondary | ICD-10-CM | POA: Insufficient documentation

## 2023-11-22 DIAGNOSIS — N903 Dysplasia of vulva, unspecified: Secondary | ICD-10-CM | POA: Diagnosis not present

## 2023-11-22 DIAGNOSIS — Z8544 Personal history of malignant neoplasm of other female genital organs: Secondary | ICD-10-CM

## 2023-11-22 DIAGNOSIS — Z6835 Body mass index (BMI) 35.0-35.9, adult: Secondary | ICD-10-CM | POA: Diagnosis not present

## 2023-11-22 DIAGNOSIS — I10 Essential (primary) hypertension: Secondary | ICD-10-CM | POA: Insufficient documentation

## 2023-11-22 DIAGNOSIS — K219 Gastro-esophageal reflux disease without esophagitis: Secondary | ICD-10-CM | POA: Insufficient documentation

## 2023-11-22 LAB — GLUCOSE, CAPILLARY
Glucose-Capillary: 309 mg/dL — ABNORMAL HIGH (ref 70–99)
Glucose-Capillary: 269 mg/dL — ABNORMAL HIGH (ref 70–99)

## 2023-11-22 SURGERY — DESTRUCTION, LESION, GENITALIA
Anesthesia: General

## 2023-11-22 MED ORDER — DEXAMETHASONE SODIUM PHOSPHATE 10 MG/ML IJ SOLN
4.0000 mg | INTRAMUSCULAR | Status: AC
  Administered 2023-11-22: 4 mg via INTRAVENOUS

## 2023-11-22 MED ORDER — MONSELS FERRIC SUBSULFATE EX SOLN
CUTANEOUS | Status: AC
Start: 2023-11-22 — End: ?
  Filled 2023-11-22: qty 8

## 2023-11-22 MED ORDER — ONDANSETRON HCL 4 MG/2ML IJ SOLN: 4.0000 mg | Freq: Four times a day (QID) | INTRAMUSCULAR | Status: DC | PRN

## 2023-11-22 MED ORDER — CHLORHEXIDINE GLUCONATE 0.12 % MT SOLN
15.0000 mL | Freq: Once | OROMUCOSAL | Status: AC
  Administered 2023-11-22: 15 mL via OROMUCOSAL

## 2023-11-22 MED ORDER — BUPIVACAINE HCL (PF) 0.25 % IJ SOLN
INTRAMUSCULAR | Status: AC
  Filled 2023-11-22: qty 30

## 2023-11-22 MED ORDER — FENTANYL CITRATE (PF) 100 MCG/2ML IJ SOLN
INTRAMUSCULAR | Status: DC | PRN
Start: 2023-11-22 — End: 2023-11-22
  Administered 2023-11-22 (×4): 50 ug via INTRAVENOUS

## 2023-11-22 MED ORDER — INSULIN ASPART 100 UNIT/ML IJ SOLN
0.0000 [IU] | INTRAMUSCULAR | Status: DC | PRN
  Administered 2023-11-22: 5 [IU] via SUBCUTANEOUS

## 2023-11-22 MED ORDER — INSULIN ASPART 100 UNIT/ML IJ SOLN
INTRAMUSCULAR | Status: AC
  Filled 2023-11-22: qty 1

## 2023-11-22 MED ORDER — SILVER NITRATE-POT NITRATE 75-25 % EX MISC
CUTANEOUS | Status: AC
  Filled 2023-11-22: qty 10

## 2023-11-22 MED ORDER — ACETAMINOPHEN 500 MG PO TABS
1000.0000 mg | ORAL_TABLET | ORAL | Status: AC
  Administered 2023-11-22: 1000 mg via ORAL
  Filled 2023-11-22: qty 2

## 2023-11-22 MED ORDER — ACETIC ACID 5 % SOLN
Status: AC
  Filled 2023-11-22: qty 12

## 2023-11-22 MED ORDER — GLYCOPYRROLATE 0.2 MG/ML IJ SOLN
INTRAMUSCULAR | Status: DC | PRN
Start: 2023-11-22 — End: 2023-11-22
  Administered 2023-11-22 (×2): .1 mg via INTRAVENOUS

## 2023-11-22 MED ORDER — SILVER SULFADIAZINE 1 % EX CREA
1.0000 | TOPICAL_CREAM | CUTANEOUS | Status: AC
  Administered 2023-11-22: 1 via TOPICAL
  Filled 2023-11-22: qty 50

## 2023-11-22 MED ORDER — PROPOFOL 10 MG/ML IV BOLUS
INTRAVENOUS | Status: AC
  Filled 2023-11-22: qty 20

## 2023-11-22 MED ORDER — FENTANYL CITRATE PF 50 MCG/ML IJ SOSY
25.0000 ug | PREFILLED_SYRINGE | INTRAMUSCULAR | Status: DC | PRN
Start: 2023-11-22 — End: 2023-11-22

## 2023-11-22 MED ORDER — TRAMADOL HCL 50 MG PO TABS: 50.0000 mg | ORAL_TABLET | Freq: Four times a day (QID) | ORAL | 0 refills | Status: DC | PRN

## 2023-11-22 MED ORDER — ORAL CARE MOUTH RINSE: 15.0000 mL | Freq: Once | OROMUCOSAL | Status: AC

## 2023-11-22 MED ORDER — IODINE STRONG (LUGOLS) 5 % PO SOLN
ORAL | Status: AC
  Filled 2023-11-22: qty 1

## 2023-11-22 MED ORDER — SENNOSIDES-DOCUSATE SODIUM 8.6-50 MG PO TABS: 2.0000 | ORAL_TABLET | Freq: Every day | ORAL | 0 refills | Status: DC

## 2023-11-22 MED ORDER — SCOPOLAMINE 1 MG/3DAYS TD PT72
1.0000 | MEDICATED_PATCH | TRANSDERMAL | Status: DC
  Administered 2023-11-22: 1.5 mg via TRANSDERMAL
  Filled 2023-11-22: qty 1

## 2023-11-22 MED ORDER — MIDAZOLAM HCL 2 MG/2ML IJ SOLN
INTRAMUSCULAR | Status: DC | PRN
  Administered 2023-11-22: 2 mg via INTRAVENOUS

## 2023-11-22 MED ORDER — LIDOCAINE HCL (PF) 2 % IJ SOLN
INTRAMUSCULAR | Status: DC | PRN
  Administered 2023-11-22: 60 mg via INTRADERMAL

## 2023-11-22 MED ORDER — OXYCODONE HCL 5 MG/5ML PO SOLN: 5.0000 mg | Freq: Once | ORAL | Status: DC | PRN

## 2023-11-22 MED ORDER — BUPIVACAINE HCL (PF) 0.25 % IJ SOLN
INTRAMUSCULAR | Status: DC | PRN
  Administered 2023-11-22: 10 mL

## 2023-11-22 MED ORDER — FENTANYL CITRATE (PF) 100 MCG/2ML IJ SOLN
INTRAMUSCULAR | Status: AC
  Filled 2023-11-22: qty 2

## 2023-11-22 MED ORDER — INSULIN ASPART 100 UNIT/ML IJ SOLN: 0.0000 [IU] | INTRAMUSCULAR | Status: DC | PRN

## 2023-11-22 MED ORDER — ACETIC ACID 5 % SOLN
1.0000 | Freq: Once | Status: AC
  Administered 2023-11-22: 1 via TOPICAL
  Filled 2023-11-22: qty 1

## 2023-11-22 MED ORDER — BUPIVACAINE LIPOSOME 1.3 % IJ SUSP
INTRAMUSCULAR | Status: AC
  Filled 2023-11-22: qty 20

## 2023-11-22 MED ORDER — BUPIVACAINE-EPINEPHRINE (PF) 0.25% -1:200000 IJ SOLN
INTRAMUSCULAR | Status: AC
  Filled 2023-11-22: qty 30

## 2023-11-22 MED ORDER — ONDANSETRON HCL 4 MG/2ML IJ SOLN
INTRAMUSCULAR | Status: DC | PRN
  Administered 2023-11-22: 4 mg via INTRAVENOUS

## 2023-11-22 MED ORDER — MIDAZOLAM HCL 2 MG/2ML IJ SOLN
INTRAMUSCULAR | Status: AC
  Filled 2023-11-22: qty 2

## 2023-11-22 MED ORDER — DEXAMETHASONE SODIUM PHOSPHATE 10 MG/ML IJ SOLN
INTRAMUSCULAR | Status: AC
  Filled 2023-11-22: qty 1

## 2023-11-22 MED ORDER — PHENYLEPHRINE 80 MCG/ML (10ML) SYRINGE FOR IV PUSH (FOR BLOOD PRESSURE SUPPORT)
PREFILLED_SYRINGE | INTRAVENOUS | Status: DC | PRN
  Administered 2023-11-22: 120 ug via INTRAVENOUS
  Administered 2023-11-22 (×2): 80 ug via INTRAVENOUS

## 2023-11-22 MED ORDER — OXYCODONE HCL 5 MG PO TABS: 5.0000 mg | ORAL_TABLET | Freq: Once | ORAL | Status: DC | PRN

## 2023-11-22 MED ORDER — ONDANSETRON HCL 4 MG/2ML IJ SOLN
INTRAMUSCULAR | Status: AC
  Filled 2023-11-22: qty 2

## 2023-11-22 MED ORDER — PROPOFOL 10 MG/ML IV BOLUS
INTRAVENOUS | Status: DC | PRN
  Administered 2023-11-22: 200 mg via INTRAVENOUS
  Administered 2023-11-22: 50 mg via INTRAVENOUS

## 2023-11-22 MED ORDER — LACTATED RINGERS IV SOLN: INTRAVENOUS | Status: DC

## 2023-11-22 MED ORDER — LIDOCAINE HCL (PF) 1 % IJ SOLN
INTRAMUSCULAR | Status: AC
  Filled 2023-11-22: qty 30

## 2023-11-22 MED ORDER — SILVER SULFADIAZINE 1 % EX CREA: 1.0000 | TOPICAL_CREAM | Freq: Two times a day (BID) | CUTANEOUS | Status: DC | PRN

## 2023-11-22 MED FILL — Acetic Acid Soln 5%: Qty: 500 | Status: AC

## 2023-11-22 SURGICAL SUPPLY — 30 items
BLADE SURG 15 STRL LF DISP TIS (BLADE) IMPLANT
DEPRESSOR TONGUE 6 IN STERILE (GAUZE/BANDAGES/DRESSINGS) ×1 IMPLANT
DRAPE UNDERBUTTOCKS STRL (DISPOSABLE) IMPLANT
DRSG TELFA 3X8 NADH STRL (GAUZE/BANDAGES/DRESSINGS) IMPLANT
ELECT BALL LEEP 3MM BLK (ELECTRODE) IMPLANT
GLOVE BIOGEL PI MICRO STRL 6 (GLOVE) ×4 IMPLANT
GOWN STRL REUS W/ TWL LRG LVL3 (GOWN DISPOSABLE) ×1 IMPLANT
KIT BASIN OR (CUSTOM PROCEDURE TRAY) IMPLANT
NDL HYPO 22X1.5 SAFETY MO (MISCELLANEOUS) IMPLANT
NDL SPNL 22GX7 QUINCKE BK (NEEDLE) IMPLANT
NEEDLE HYPO 22X1.5 SAFETY MO (MISCELLANEOUS) IMPLANT
NEEDLE SPNL 22GX7 QUINCKE BK (NEEDLE) IMPLANT
PACK LITHOTOMY IV (CUSTOM PROCEDURE TRAY) ×1 IMPLANT
PAD PREP 24X48 CUFFED NSTRL (MISCELLANEOUS) ×1 IMPLANT
PENCIL SMOKE EVACUATOR (MISCELLANEOUS) IMPLANT
PUNCH BIOPSY 3 (MISCELLANEOUS) IMPLANT
SCOPETTES 8 STERILE (MISCELLANEOUS) ×2 IMPLANT
SOL PREP POV-IOD 4OZ 10% (MISCELLANEOUS) ×1 IMPLANT
SUT VIC AB 0 CT1 36 (SUTURE) IMPLANT
SUT VIC AB 2-0 SH 27XBRD (SUTURE) IMPLANT
SUT VIC AB 3-0 PS2 18XBRD (SUTURE) IMPLANT
SUT VIC AB 3-0 SH 27X BRD (SUTURE) IMPLANT
SUT VIC AB 4-0 PS2 18 (SUTURE) IMPLANT
SUT VICRYL 0 UR6 27IN ABS (SUTURE) IMPLANT
SYR CONTROL 10ML LL (SYRINGE) ×1 IMPLANT
TOWEL OR 17X26 10 PK STRL BLUE (TOWEL DISPOSABLE) IMPLANT
TUBING CONNECTING 10 (TUBING) IMPLANT
VACUUM HOSE 7/8X10 W/ WAND (MISCELLANEOUS) ×1 IMPLANT
WATER STERILE IRR 500ML POUR (IV SOLUTION) ×1 IMPLANT
YANKAUER SUCT BULB TIP 10FT TU (MISCELLANEOUS) IMPLANT

## 2023-11-22 NOTE — Transfer of Care (Signed)
 Immediate Anesthesia Transfer of Care Note  Patient: Anna Case  Procedure(s) Performed: CO2 LASER ABLATION OF THE VULVA  Patient Location: PACU  Anesthesia Type:General  Level of Consciousness: sedated and drowsy  Airway & Oxygen Therapy: Patient Spontanous Breathing  Post-op Assessment: Report given to RN  Post vital signs: Reviewed and stable  Last Vitals:  Vitals Value Taken Time  BP 99/66 11/22/23 0817  Temp    Pulse 80 11/22/23 0820  Resp 10 11/22/23 0820  SpO2 99 % 11/22/23 0820  Vitals shown include unfiled device data.  Last Pain:  Vitals:   11/22/23 0607  TempSrc:   PainSc: 4       Patients Stated Pain Goal: 4 (11/22/23 4098)  Complications: No notable events documented.

## 2023-11-22 NOTE — H&P (Signed)
 Brief Pre-operative History & Physical  Patient name: Anna Case CSN: [id number] MRN: [id number] Admit Date: 11/22/2023 Date of Surgery: 11/22/2023 Performing Service: Gynecology   Code Status: Full Code    Assessment & Plan    Jiannah is a 53 y.o. female with VULVAR DYSPLASIA, who presents for: Procedure(s) (LRB): DESTRUCTION, LESION, GENITALIA (N/A).   Consent obtained in office is accurate. Risks, benefits, and alternatives to surgery were reviewed, and all questions were answered.  Proceed to the OR as planned.     History of Present Illness:  Anna Case is a 53 y.o. female with VULVAR DYSPLASIA. She was recently seen in clinic, where a detailed HPI can be found. She was noted to benefit from: Procedure(s) (LRB): DESTRUCTION, LESION, GENITALIA (N/A).   Medical History Past Medical History:  Diagnosis Date   Anemia    iron   Cancer (HCC)    vulva   GERD (gastroesophageal reflux disease)    Glaucoma    Hypertension    Pre-diabetes    Surgical History Past Surgical History:  Procedure Laterality Date   CO2 LASER APPLICATION N/A 01/04/2023   Procedure: CO2 LASER APPLICATION TO VULVA;  Surgeon: Derrel Flies, MD;  Location: [facility];  Service: Gynecology;  Laterality: N/A;   COLONOSCOPY W/ POLYPECTOMY     ROBOTIC ASSISTED TOTAL HYSTERECTOMY WITH BILATERAL SALPINGO OOPHERECTOMY N/A 01/04/2023   Procedure: XI ROBOTIC ASSISTED TOTAL HYSTERECTOMY WITH BILATERAL SALPINGO OOPHORECTOMY;  Surgeon: Derrel Flies, MD;  Location: [facility];  Service: Gynecology;  Laterality: N/A;   SENTINEL NODE BIOPSY N/A 01/04/2023   Procedure: SENTINEL NODE BIOPSY;  Surgeon: Derrel Flies, MD;  Location: [facility];  Service: Gynecology;  Laterality: N/A;   VULVA SURGERY     Allergies Patient has no known allergies.  Medications   Current Facility-Administered Medications  Medication Dose Route Frequency Provider Last Rate Last Admin   acetic acid  5 % topical solution 1  Application  1 Application Topical Once Chrishelle Zito, MD       dexamethasone  (DECADRON ) injection 4 mg  4 mg Intravenous On Call to OR Cross, Melissa D, NP       insulin aspart (novoLOG) 100 UNIT/ML injection            insulin aspart (novoLOG) injection 0-7 Units  0-7 Units Subcutaneous Q2H PRN Ellena Gurney, MD   5 Units at 11/22/23 0700   lactated ringers  infusion   Intravenous Continuous Rosalita Combe, MD 10 mL/hr at 11/22/23 0618 New Bag at 11/22/23 0618   scopolamine  (TRANSDERM-SCOP) 1 MG/3DAYS 1.5 mg  1 patch Transdermal On Call to OR Cross, Melissa D, NP   1.5 mg at 11/22/23 0615    Vital Signs BP (!) 146/92   Pulse 85   Temp 98.1 F (36.7 C) (Oral)   Resp 16   Ht 5' 8.5" (1.74 m)   Wt 239 lb (108.4 kg)   LMP 01/04/2023   SpO2 99%   BMI 35.81 kg/m  Facility age limit for growth %iles is 20 years. Facility age limit for growth %iles is 20 years..   Physical Exam General: Well developed, appears stated age, in no acute distress  Mental status: Alert and oriented x3 Cardiovascular: Normal Pulmonary: Symmetric chest rise, unlabored breathing Relevant System for Surgery: Surgical site examination deferred to the OR   Labs and Studies: Lab Results  Component Value Date   WBC 6.6 11/14/2023   HGB 12.4 11/14/2023   HCT 39.6 11/14/2023   PLT 302 11/14/2023  No results found for: "INR", "APTT" \

## 2023-11-22 NOTE — Anesthesia Preprocedure Evaluation (Signed)
 Anesthesia Evaluation  Patient identified by MRN, date of birth, ID band Patient awake    Reviewed: Allergy & Precautions, H&P , NPO status , Patient's Chart, lab work & pertinent test results  Airway Mallampati: II   Neck ROM: full    Dental   Pulmonary Current Smoker and Patient abstained from smoking.   breath sounds clear to auscultation       Cardiovascular hypertension,  Rhythm:regular Rate:Normal     Neuro/Psych    GI/Hepatic ,GERD  ,,  Endo/Other    Class 3 obesity  Renal/GU      Musculoskeletal   Abdominal   Peds  Hematology   Anesthesia Other Findings   Reproductive/Obstetrics                             Anesthesia Physical Anesthesia Plan  ASA: 2  Anesthesia Plan: General   Post-op Pain Management:    Induction: Intravenous  PONV Risk Score and Plan: 2 and Ondansetron , Dexamethasone , Midazolam  and Treatment may vary due to age or medical condition  Airway Management Planned: LMA  Additional Equipment:   Intra-op Plan:   Post-operative Plan: Extubation in OR  Informed Consent: I have reviewed the patients History and Physical, chart, labs and discussed the procedure including the risks, benefits and alternatives for the proposed anesthesia with the patient or authorized representative who has indicated his/her understanding and acceptance.     Dental advisory given  Plan Discussed with: CRNA, Anesthesiologist and Surgeon  Anesthesia Plan Comments:        Anesthesia Quick Evaluation

## 2023-11-22 NOTE — Op Note (Signed)
 GYNECOLOGIC ONCOLOGY OPERATIVE NOTE  Date of Service: 11/22/2023  Preoperative Diagnosis: Vulvar dysplasia, history of vulvar cancer  Postoperative Diagnosis: Same  Procedures: CO2 LASER ABLATION OF THE VULVA  Surgeon: Derrel Flies, MD  Assistants: None  Anesthesia: General  Estimated Blood Loss: Minimal  Fluids: 400 ml, crystalloid  Urine Output: 10 ml, clear yellow  Findings: Chronic skin changes with thickened skin of the mons and bilateral groin creases. Vulva with evidence of prior posterior radical vulvectomy, well healed. No discrete lesions on initial examination. With acetic acid  applied, acetowhite changes of the posterior fourchette with two small 3mm raised acetowhite areas of the posterior fourchette and satellite lesions of the bilateral posterior labia majora.  Specimens: None  Complications:  None  Indications for Procedure: Anna Case is a 53 y.o. woman with a history of vulvar cancer and recent biopsy proven recurrent vulvar dysplasia, intolerant of topical treatment who presents today for laser treatment of vulvar dysplasia.  Prior to the procedure, all risks, benefits, and alternatives were discussed and informed surgical consent was signed.  Procedure: Patient was taken to the operating room where general anesthesia was achieved.  She was positioned in dorsal lithotomy and prepped and draped.     A thorough exam of the vulva was done after placing a moist sponge with acetic acid  on the vulva with the findings as noted above. Decision was made to proceed with laser ablation.  The area to be removed was outlined to a 5mm margin. Using the CO2 laser, energy was applied with 8 Watts of continuous power.  The ablation was performed down to a second surgical layer.  The wound bed was hemostatic. 0.25% marcine was injected. Silvadene  cream was applied to the wound bed. An in and out catheter was inserted into the bladder.    Patient tolerated the procedure  well. Sponge, lap, and instrument counts were correct.  No perioperative antibiotic prophylaxis was indicated for this procedure.  She was extubated and taken to the PACU in stable condition.  Premarin/Silvadene  cream was provided postoperatively.  Derrel Flies, MD Gynecologic Oncology

## 2023-11-22 NOTE — Anesthesia Procedure Notes (Signed)
 Procedure Name: LMA Insertion Date/Time: 11/22/2023 7:38 AM  Performed by: Raylene Calamity, CRNAPre-anesthesia Checklist: Patient identified, Emergency Drugs available, Suction available and Patient being monitored Patient Re-evaluated:Patient Re-evaluated prior to induction Oxygen Delivery Method: Circle System Utilized Preoxygenation: Pre-oxygenation with 100% oxygen Induction Type: IV induction Ventilation: Mask ventilation without difficulty LMA: LMA inserted LMA Size: 4.0 Number of attempts: 1 Airway Equipment and Method: Bite block Placement Confirmation: positive ETCO2 Tube secured with: Tape Dental Injury: Teeth and Oropharynx as per pre-operative assessment

## 2023-11-23 ENCOUNTER — Encounter (HOSPITAL_COMMUNITY): Payer: Self-pay | Admitting: Psychiatry

## 2023-11-23 ENCOUNTER — Telehealth: Payer: Self-pay | Admitting: *Deleted

## 2023-11-23 NOTE — Anesthesia Postprocedure Evaluation (Signed)
 Anesthesia Post Note  Patient: Anna Case  Procedure(s) Performed: CO2 LASER ABLATION OF THE VULVA     Patient location during evaluation: PACU Anesthesia Type: General Level of consciousness: awake and alert Pain management: pain level controlled Vital Signs Assessment: post-procedure vital signs reviewed and stable Respiratory status: spontaneous breathing, nonlabored ventilation, respiratory function stable and patient connected to nasal cannula oxygen Cardiovascular status: blood pressure returned to baseline and stable Postop Assessment: no apparent nausea or vomiting Anesthetic complications: no   No notable events documented.  Last Vitals:  Vitals:   11/22/23 0929 11/22/23 0931  BP: 118/84 118/84  Pulse: 74 82  Resp:    Temp: (!) 36.3 C (!) 36.3 C  SpO2: 96% 97%    Last Pain:  Vitals:   11/22/23 0931  TempSrc:   PainSc: 0-No pain                 Malachi Suderman S

## 2023-11-23 NOTE — Telephone Encounter (Signed)
 Spoke with Ms. Anna Case this morning. She states she is eating, drinking and urinating well. She has not had a BM yet but is passing gas. She is encouraged to take senokot as prescribed and drink plenty of water . She denies fever or chills. Incisions are dry and intact. She rates her pain 2/10. States she hasn't needed to take anything for pain.   Instructed to call office with any fever, chills, purulent drainage, uncontrolled pain or any other questions or concerns. Patient verbalizes understanding.   Pt aware of post op appointments as well as the office number [phone] and after hours number [phone] to call if she has any questions or concerns

## 2023-11-23 NOTE — Telephone Encounter (Signed)
 Attempted to reach Anna Case for pre-op call. Unable to leave message do to mail box being full.

## 2023-11-23 NOTE — Telephone Encounter (Signed)
 2nd attempt to reach patient. Unable to leave message due to mail box not set up.

## 2023-12-12 ENCOUNTER — Encounter: Payer: Self-pay | Admitting: Psychiatry

## 2023-12-12 ENCOUNTER — Inpatient Hospital Stay: Payer: Medicare (Managed Care) | Attending: Psychiatry | Admitting: Psychiatry

## 2023-12-12 VITALS — BP 139/79 | HR 84 | Temp 97.5°F | Resp 17 | Ht 68.5 in | Wt 239.4 lb

## 2023-12-12 DIAGNOSIS — C541 Malignant neoplasm of endometrium: Secondary | ICD-10-CM

## 2023-12-12 DIAGNOSIS — Z90710 Acquired absence of both cervix and uterus: Secondary | ICD-10-CM | POA: Insufficient documentation

## 2023-12-12 DIAGNOSIS — Z9079 Acquired absence of other genital organ(s): Secondary | ICD-10-CM | POA: Diagnosis not present

## 2023-12-12 DIAGNOSIS — Z8542 Personal history of malignant neoplasm of other parts of uterus: Secondary | ICD-10-CM | POA: Diagnosis present

## 2023-12-12 DIAGNOSIS — L280 Lichen simplex chronicus: Secondary | ICD-10-CM | POA: Insufficient documentation

## 2023-12-12 DIAGNOSIS — N903 Dysplasia of vulva, unspecified: Secondary | ICD-10-CM | POA: Diagnosis not present

## 2023-12-12 DIAGNOSIS — C519 Malignant neoplasm of vulva, unspecified: Secondary | ICD-10-CM

## 2023-12-12 DIAGNOSIS — Z90722 Acquired absence of ovaries, bilateral: Secondary | ICD-10-CM | POA: Diagnosis not present

## 2023-12-12 DIAGNOSIS — B49 Unspecified mycosis: Secondary | ICD-10-CM | POA: Diagnosis not present

## 2023-12-12 DIAGNOSIS — Z8544 Personal history of malignant neoplasm of other female genital organs: Secondary | ICD-10-CM | POA: Diagnosis present

## 2023-12-12 DIAGNOSIS — Z7189 Other specified counseling: Secondary | ICD-10-CM | POA: Diagnosis not present

## 2023-12-12 NOTE — Patient Instructions (Signed)
 It was a pleasure to see you in clinic today. - Healing well - Return visit planned for 3 months with CT scan prior.   Thank you very much for allowing me to provide care for you today.  I appreciate your confidence in choosing our Gynecologic Oncology team at [facility].  If you have any questions about your visit today please call our office or send us  a MyChart message and we will get back to you as soon as possible.

## 2023-12-12 NOTE — Progress Notes (Signed)
 Gynecologic Oncology Return Clinic Visit  Date of Service: 12/12/2023 Referring Provider: Terresa Ferry, NP [address] [place],  [place] [address]  Assessment & Plan: Anna Case is a 53 y.o. 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  Vulvar cancer and dysplasia: - Pt recovering well from surgery and healing appropriately postoperatively - Intraoperative findings reviewed. - Ongoing postoperative expectations reviewed. - Otherwise, surveillance for cancer recommended every 3-6 months for the first 2 years, then every 6-60mo for years 3-5.  - Will plan to get updated CT a/p for surveillance of vulvar cancer. Ordered today to occur prior to next visit.  Endometrial cancer: - NED on last exam. - No high-intermediate risk factors. - Recommend observation. - Signs/symptoms of recurrence reviewed. - Surveillance reviewed. Follow-up q6 months x2 years then yearly.   Hotflashes: - Recommended against hormonal therapy in the setting of endometrial cancer - Pt still bothered by hotflashes but declines medication management.  Lichen simplex chronicus with fungal infection: - Strongly recommend referral Dermatology. Referral previously placed. Is scheduled for 02/27/24. Encouraged to keep.  RTC 3 mo  Derrel Flies, MD Gynecologic Oncology   Medical Decision Making I personally spent  TOTAL 10 minutes face-to-face and non-face-to-face in the care of this patient, which includes all pre, intra, and post visit time on the date of service. The discussion of vulvar dysplasia and vulvar cancer is beyond the scope of routine postoperative care.  ----------------------- Reason for Visit: Postop/treatment  discussion  Treatment History: Oncology History  Vulvar cancer (HCC)  09/20/2022 Initial Biopsy   FINAL MICROSCOPIC DIAGNOSIS:  A. VULVAR MASS, RIGHT, BIOPSY:  - Invasive well to moderately differentiated squamous cell carcinoma,  see comment  B. INTROITUS, BIOPSY:  - Low-grade squamous intraepithelial lesion (VIN1, condyloma), see  comment    09/20/2022 Initial Diagnosis   Vulvar cancer   10/13/2022 Imaging   PET: IMPRESSION: Postop changes from right vulvectomy. No evidence of residual or recurrent vulvar mass.   Intense hypermetabolic activity throughout the endometrial cavity of the uterus, highly suspicious for endometrial carcinoma.   Sub-cm bilateral inguinal lymph nodes show mild hypermetabolism. Metastatic disease cannot be excluded.   Sub-centimeter bilateral axillary lymph nodes show mild hypermetabolism, and are indeterminate.   Focal hypermetabolism in lateral right thyroid  lobe. Thyroid  carcinoma cannot be excluded. Recommend thyroid  US  and biopsy. (ref: J Am Coll Radiol. 2015 Feb;12(2): 143-50).   Sub-cm bilateral upper neck lymph nodes show mild hypermetabolism. Metastatic disease cannot be excluded, possibly from thyroid  carcinoma if the hypermetabolic right thyroid  nodule is malignant.   Endometrial cancer (HCC)  10/18/2022 Initial Diagnosis   Endometrial cancer   10/18/2022 Initial Biopsy   FINAL MICROSCOPIC DIAGNOSIS:  A. ENDOMETRIUM, BIOPSY:  - Endometrioid carcinoma, FIGO grade 1  - See comment  COMMENT:  Part A: Immunohistochemical staining for p53 and MMR is pending and will  be reported in an addendum.  Dr. Talmadge Fail reviewed the case and agrees with  the above diagnosis.  IHC EXPRESSION RESULTS  TEST           RESULT  MLH1:          Preserved nuclear expression  MSH2:  Preserved nuclear expression  MSH6:          Preserved nuclear expression  PMS2:          Preserved nuclear expression      Interval History: Doing well postop. No pain.  Eating and drinking well. Initially had some constipation but that is improved and back to normal now. Voiding without issue. No bleeding. Using the cream on the vulva.    Past Medical/Surgical History: Past Medical History:  Diagnosis Date   Anemia    iron   Cancer (HCC)    vulva   GERD (gastroesophageal reflux disease)    Glaucoma    Hypertension    Pre-diabetes     Past Surgical History:  Procedure Laterality Date   CO2 LASER APPLICATION N/A 01/04/2023   Procedure: CO2 LASER APPLICATION TO VULVA;  Surgeon: Derrel Flies, MD;  Location: [facility];  Service: Gynecology;  Laterality: N/A;   COLONOSCOPY W/ POLYPECTOMY     LESION DESTRUCTION N/A 11/22/2023   Procedure: CO2 LASER ABLATION OF THE VULVA;  Surgeon: Derrel Flies, MD;  Location: [facility];  Service: Gynecology;  Laterality: N/A;  CO2 LASER OF VULVAR   ROBOTIC ASSISTED TOTAL HYSTERECTOMY WITH BILATERAL SALPINGO OOPHERECTOMY N/A 01/04/2023   Procedure: XI ROBOTIC ASSISTED TOTAL HYSTERECTOMY WITH BILATERAL SALPINGO OOPHORECTOMY;  Surgeon: Derrel Flies, MD;  Location: [facility];  Service: Gynecology;  Laterality: N/A;   SENTINEL NODE BIOPSY N/A 01/04/2023   Procedure: SENTINEL NODE BIOPSY;  Surgeon: Derrel Flies, MD;  Location: [facility];  Service: Gynecology;  Laterality: N/A;   VULVA SURGERY      Family History  Problem Relation Age of Onset   Diabetes Mother    Heart attack Father    Prostate cancer Maternal Uncle 20       metastatic   Colon cancer Maternal Uncle    Prostate cancer Paternal Uncle 85   Breast cancer Other        maternal first cousin once removed   Colon cancer Other        grandmother's sister x2   Prostate cancer Other        grandmother's brother   Prostate cancer Other        grandfather's brother x2   Breast cancer Other        grandfather's sister   Ovarian cancer Neg Hx    Endometrial cancer Neg Hx    Pancreatic cancer Neg Hx     Social History   Socioeconomic History   Marital  status: Single    Spouse name: Not on file   Number of children: Not on file   Years of education: Not on file   Highest education level: Not on file  Occupational History   Not on file  Tobacco Use   Smoking status: Every Day    Current packs/day: 0.25    Average packs/day: 0.3 packs/day for 20.0 years (5.0 ttl pk-yrs)    Types: Cigarettes   Smokeless tobacco: Never  Vaping Use   Vaping status: Never Used  Substance and Sexual Activity   Alcohol  use: No   Drug use: Not Currently    Types: Marijuana   Sexual activity: Not Currently    Partners: Male  Other Topics Concern   Not on file  Social History Narrative   Not on file   Social Drivers of Health   Financial Resource Strain: Not on file  Food Insecurity: Food Insecurity Present (01/04/2023)   Hunger Vital Sign  Worried About Programme researcher, broadcasting/film/video in the Last Year: Sometimes true    [employer] of Food in the Last Year: Sometimes true  Transportation Needs: No Transportation Needs (01/04/2023)   PRAPARE - Administrator, Civil Service (Medical): No    Lack of Transportation (Non-Medical): No  Physical Activity: Not on file  Stress: Not on file  Social Connections: Not on file    Current Medications:  Current Outpatient Medications:    amLODipine -valsartan (EXFORGE) 10-320 MG tablet, Take 1 tablet by mouth daily., Disp: , Rfl:    ascorbic acid (VITAMIN C) 500 MG tablet, Take 500 mg by mouth daily., Disp: , Rfl:    brimonidine  (ALPHAGAN ) 0.2 % ophthalmic solution, Place 1 drop into both eyes in the morning and at bedtime., Disp: , Rfl:    clobetasol  cream (TEMOVATE ) 0.05 %, Apply 1 Application topically daily as needed (itching)., Disp: 30 g, Rfl: 3   dorzolamide -timolol  (COSOPT ) 2-0.5 % ophthalmic solution, Place 1 drop into both eyes 2 (two) times daily., Disp: , Rfl:    ferrous sulfate 325 (65 FE) MG tablet, Take 325 mg by mouth daily with breakfast., Disp: , Rfl:    ROCKLATAN  0.02-0.005 % SOLN, Place 1  drop into both eyes at bedtime., Disp: , Rfl:    senna-docusate (SENOKOT-S) 8.6-50 MG tablet, Take 2 tablets by mouth at bedtime. For AFTER surgery, do not take if having diarrhea, Disp: 30 tablet, Rfl: 0   silver  sulfADIAZINE  (SILVADENE ) 1 % cream, Apply 1 Application topically 2 (two) times daily as needed., Disp: , Rfl:    traMADol  (ULTRAM ) 50 MG tablet, Take 1 tablet (50 mg total) by mouth every 6 (six) hours as needed for severe pain (pain score 7-10). For AFTER surgery only, do not take and drive, Disp: 10 tablet, Rfl: 0  Review of Symptoms: Complete 10-system review is positive for: none  Physical Exam: BP 139/79 (BP Location: Left Arm, Patient Position: Sitting)   Pulse 84   Temp (!) 97.5 F (36.4 C) (Tympanic)   Resp 17   Ht 5' 8.5" (1.74 m)   Wt 239 lb 6.4 oz (108.6 kg)   LMP 01/04/2023   SpO2 100%   BMI 35.87 kg/m  General: Alert, oriented, no acute distress. HEENT: Normocephalic, atraumatic. Neck symmetric without masses. Sclera anicteric.  Chest: Normal work of breathing.  Abdomen: Soft, nontender.   Extremities: Grossly normal range of motion.  Warm, well perfused.  No edema bilaterally. Skin: Skin under pannus and bilateral groins with thickening and darkening as prior. Moisture under pannus. Generalized firmness underlying her skin changes. GU: External genitalia with evidence of prior posterior radical vulvectomy, well-healed.  Evidence of recent laser to the posterior fourchette, healing well.  Speculum and bimanual exam deferred today. Exam chaperoned by Kimberly [place], CMA   Laboratory & Radiologic Studies:  none

## 2023-12-13 DIAGNOSIS — Z713 Dietary counseling and surveillance: Secondary | ICD-10-CM | POA: Diagnosis not present

## 2023-12-13 DIAGNOSIS — R631 Polydipsia: Secondary | ICD-10-CM | POA: Diagnosis not present

## 2023-12-13 DIAGNOSIS — Z6836 Body mass index (BMI) 36.0-36.9, adult: Secondary | ICD-10-CM | POA: Diagnosis not present

## 2023-12-13 DIAGNOSIS — I10 Essential (primary) hypertension: Secondary | ICD-10-CM | POA: Diagnosis not present

## 2023-12-13 DIAGNOSIS — E1165 Type 2 diabetes mellitus with hyperglycemia: Secondary | ICD-10-CM | POA: Diagnosis not present

## 2023-12-13 DIAGNOSIS — F17210 Nicotine dependence, cigarettes, uncomplicated: Secondary | ICD-10-CM | POA: Diagnosis not present

## 2023-12-13 DIAGNOSIS — Z299 Encounter for prophylactic measures, unspecified: Secondary | ICD-10-CM | POA: Diagnosis not present

## 2024-01-24 DIAGNOSIS — R631 Polydipsia: Secondary | ICD-10-CM | POA: Diagnosis not present

## 2024-01-24 DIAGNOSIS — Z6839 Body mass index (BMI) 39.0-39.9, adult: Secondary | ICD-10-CM | POA: Diagnosis not present

## 2024-01-24 DIAGNOSIS — I10 Essential (primary) hypertension: Secondary | ICD-10-CM | POA: Diagnosis not present

## 2024-01-24 DIAGNOSIS — Z299 Encounter for prophylactic measures, unspecified: Secondary | ICD-10-CM | POA: Diagnosis not present

## 2024-01-24 DIAGNOSIS — E1165 Type 2 diabetes mellitus with hyperglycemia: Secondary | ICD-10-CM | POA: Diagnosis not present

## 2024-02-21 DIAGNOSIS — H401133 Primary open-angle glaucoma, bilateral, severe stage: Secondary | ICD-10-CM | POA: Diagnosis not present

## 2024-02-21 DIAGNOSIS — Z7689 Persons encountering health services in other specified circumstances: Secondary | ICD-10-CM | POA: Diagnosis not present

## 2024-02-21 DIAGNOSIS — H2513 Age-related nuclear cataract, bilateral: Secondary | ICD-10-CM | POA: Diagnosis not present

## 2024-02-21 DIAGNOSIS — H04123 Dry eye syndrome of bilateral lacrimal glands: Secondary | ICD-10-CM | POA: Diagnosis not present

## 2024-02-27 ENCOUNTER — Ambulatory Visit: Payer: Medicare (Managed Care) | Admitting: Dermatology

## 2024-02-27 ENCOUNTER — Encounter: Payer: Self-pay | Admitting: Dermatology

## 2024-02-27 VITALS — BP 148/91 | HR 83

## 2024-02-27 DIAGNOSIS — L280 Lichen simplex chronicus: Secondary | ICD-10-CM | POA: Diagnosis not present

## 2024-02-27 DIAGNOSIS — E119 Type 2 diabetes mellitus without complications: Secondary | ICD-10-CM

## 2024-02-27 DIAGNOSIS — D485 Neoplasm of uncertain behavior of skin: Secondary | ICD-10-CM

## 2024-02-27 DIAGNOSIS — R238 Other skin changes: Secondary | ICD-10-CM

## 2024-02-27 DIAGNOSIS — E139 Other specified diabetes mellitus without complications: Secondary | ICD-10-CM

## 2024-02-27 MED ORDER — NYSTATIN 100000 UNIT/GM EX POWD: 1.0000 | Freq: Two times a day (BID) | CUTANEOUS | 0 refills | Status: DC

## 2024-02-27 NOTE — Patient Instructions (Signed)

## 2024-02-27 NOTE — Progress Notes (Signed)
 New Patient Visit   Subjective  Anna Case is a 53 y.o. female who presents for the following: referred by her gynecologist for Lichen Simplex Chronicus with Fungal Infection  Patient states she has lichen simplex chronicus located at the groin that she would like to have examined. Patient reports the areas have been there for 2 years. She reports the areas are bothersome.Patient rates irritation 3 out of 10. She states that it becomes more irritated when she sweats. She states that the areas have not spread. Patient reports she has previously been treated for these areas. She has used topical anti-fungals and topical steroid cream (clobetasol  Propionate 0.05%). Patient stopped 6 months ago due to the rash getting worse when using the medication. She is currently not treating. Patient states that the area completely resolved when using a medication that starts with a T and ends in -zole.  She was recommended to stop wearing underwear for improvement, and has done so.   Patient has a history of vulvar cancer and underwent a CO2 laser ablation of the vulva on 11/22/2023 after being unable to tolerate treatment with imiquimod .    The patient has spots, moles and lesions to be evaluated, some may be new or changing.  The following portions of the chart were reviewed this encounter and updated as appropriate: medications, allergies, medical history  Review of Systems:  No other skin or systemic complaints except as noted in HPI or Assessment and Plan.  Objective  Well appearing patient in no apparent distress; mood and affect are within normal limits.   A focused examination was performed of the following areas: groin  Relevant exam findings are noted in the Assessment and Plan.  Right Inguinal Area Macerated hyperkeratotic lichenified plaques     Assessment & Plan   Lichenified Plaques with Maceration- Suprapubic Region and Inguinal Folds- Ddx Inverse Psoriasis vs Intertrigo  vs LS vs other The patient presents with lichenified plaques accompanied by maceration in the suprapubic and pubic regions, which have been persistent for several years. The patient has previously used clobetasol  and other unknown medications without significant improvement. To obtain a definitive diagnosis, I recommend performing punch biopsies of both affected sites. In the interim, applying nystatin  powder is advised to address any potential fungal involvement, alongside maintaining dryness of the area to reduce maceration and irritation. Additionally, given the recent diagnosis of diabetes, it is important to emphasize strict glycemic control, as elevated blood sugar levels can promote overgrowth of skin organisms and worsen the condition. Close monitoring and follow-up are essential for optimal management.  Recently Diagnosed with Diabetes - Discussed importance of sugar control for overall ideal skin health  NEOPLASM OF UNCERTAIN BEHAVIOR OF SKIN (2) Right Inguinal Area Skin / nail biopsy Type of biopsy: punch   Informed consent: discussed and consent obtained   Timeout: patient name, date of birth, surgical site, and procedure verified   Procedure prep:  Patient was prepped and draped in usual sterile fashion Prep type:  Isopropyl alcohol  Anesthesia: the lesion was anesthetized in a standard fashion   Anesthetic:  1% lidocaine  w/ epinephrine  1-100,000 buffered w/ 8.4% NaHCO3 Punch size:  4 mm Hemostasis achieved with: Gelfoam and electrodesiccation   Outcome: patient tolerated procedure well   Post-procedure details: sterile dressing applied and wound care instructions given   Dressing type: petrolatum and bandage    Specimen 1 - Surgical pathology Differential Diagnosis: inverse psoriasis vs intertrigo vs lichen sclerosis  Check Margins: No Right Genitocrural Fold  Skin / nail biopsy Type of biopsy: punch   Informed consent: discussed and consent obtained   Timeout: patient name,  date of birth, surgical site, and procedure verified   Anesthesia: the lesion was anesthetized in a standard fashion   Anesthetic:  1% lidocaine  w/ epinephrine  1-100,000 buffered w/ 8.4% NaHCO3 Punch size:  4 mm Hemostasis achieved with: Gelfoam and electrodesiccation   Outcome: patient tolerated procedure well   Post-procedure details: sterile dressing applied and wound care instructions given   Dressing type: petrolatum and bandage    Specimen 2 - Surgical pathology Differential Diagnosis:  inverse psoriasis vs intertrigo vs lichen sclerosis  Check Margins: No  Return in about 4 weeks (around 03/26/2024) for lichen planus follow up.  LILLETTE Rollene Gobble, RN, am acting as scribe for RUFUS CHRISTELLA HOLY, MD .   Documentation: I have reviewed the above documentation for accuracy and completeness, and I agree with the above.  RUFUS CHRISTELLA HOLY, MD

## 2024-02-28 LAB — SURGICAL PATHOLOGY

## 2024-02-29 ENCOUNTER — Ambulatory Visit: Payer: Self-pay | Admitting: Dermatology

## 2024-02-29 ENCOUNTER — Ambulatory Visit
Admission: RE | Admit: 2024-02-29 | Discharge: 2024-02-29 | Disposition: A | Discharge status: Home / Self Care | Payer: Medicare (Managed Care) | Source: Ambulatory Visit | Attending: Psychiatry | Admitting: Psychiatry

## 2024-02-29 DIAGNOSIS — C519 Malignant neoplasm of vulva, unspecified: Secondary | ICD-10-CM

## 2024-02-29 DIAGNOSIS — K760 Fatty (change of) liver, not elsewhere classified: Secondary | ICD-10-CM | POA: Diagnosis not present

## 2024-02-29 DIAGNOSIS — C541 Malignant neoplasm of endometrium: Secondary | ICD-10-CM

## 2024-02-29 MED ORDER — IOPAMIDOL (ISOVUE-300) INJECTION 61%
100.0000 mL | Freq: Once | INTRAVENOUS | Status: AC | PRN
Start: 2024-02-29 — End: 2024-02-29
  Administered 2024-02-29: 100 mL via INTRAVENOUS

## 2024-03-05 ENCOUNTER — Encounter: Payer: Self-pay | Admitting: Dermatology

## 2024-03-05 ENCOUNTER — Ambulatory Visit (INDEPENDENT_AMBULATORY_CARE_PROVIDER_SITE_OTHER): Payer: Medicare (Managed Care) | Admitting: Dermatology

## 2024-03-05 VITALS — BP 154/84 | HR 97

## 2024-03-05 DIAGNOSIS — L900 Lichen sclerosus et atrophicus: Secondary | ICD-10-CM

## 2024-03-05 DIAGNOSIS — L304 Erythema intertrigo: Secondary | ICD-10-CM

## 2024-03-05 MED ORDER — HALOBETASOL PROPIONATE 0.05 % EX CREA: TOPICAL_CREAM | Freq: Two times a day (BID) | CUTANEOUS | 5 refills | Status: AC

## 2024-03-05 MED ORDER — NYSTATIN 100000 UNIT/GM EX POWD: 1.0000 | Freq: Two times a day (BID) | CUTANEOUS | 5 refills | Status: AC

## 2024-03-05 MED ORDER — PIMECROLIMUS 1 % EX CREA: TOPICAL_CREAM | CUTANEOUS | 5 refills | Status: AC

## 2024-03-05 NOTE — Patient Instructions (Addendum)
 Apply Halobetasol  Topical Ointment  Monday-Friday for 4 weeks. Then apply Saturday and Sunday for 4 weeks.  Apply Elidel  Saturday and Sunday for 4 weeks. Then apply Monday-Friday for 4 weeks.    Important Information  Due to recent changes in healthcare laws, you may see results of your pathology and/or laboratory studies on MyChart before the doctors have had a chance to review them. We understand that in some cases there may be results that are confusing or concerning to you. Please understand that not all results are received at the same time and often the doctors may need to interpret multiple results in order to provide you with the best plan of care or course of treatment. Therefore, we ask that you please give us  2 business days to thoroughly review all your results before contacting the office for clarification. Should we see a critical lab result, you will be contacted sooner.   If You Need Anything After Your Visit  If you have any questions or concerns for your doctor, please call our main line at [phone] If no one answers, please leave a voicemail as directed and we will return your call as soon as possible. Messages left after 4 pm will be answered the following business day.   You may also send us  a message via MyChart. We typically respond to MyChart messages within 1-2 business days.  For prescription refills, please ask your pharmacy to contact our office. Our fax number is [phone].  If you have an urgent issue when the clinic is closed that cannot wait until the next business day, you can page your doctor at the number below.    Please note that while we do our best to be available for urgent issues outside of office hours, we are not available 24/7.   If you have an urgent issue and are unable to reach us , you may choose to seek medical care at your doctor's office, retail clinic, urgent care center, or emergency room.  If you have a medical emergency, please  immediately call 911 or go to the emergency department. In the event of inclement weather, please call our main line at [phone] for an update on the status of any delays or closures.  Dermatology Medication Tips: Please keep the boxes that topical medications come in in order to help keep track of the instructions about where and how to use these. Pharmacies typically print the medication instructions only on the boxes and not directly on the medication tubes.   If your medication is too expensive, please contact our office at [phone] or send us  a message through MyChart.   We are unable to tell what your co-pay for medications will be in advance as this is different depending on your insurance coverage. However, we may be able to find a substitute medication at lower cost or fill out paperwork to get insurance to cover a needed medication.   If a prior authorization is required to get your medication covered by your insurance company, please allow us  1-2 business days to complete this process.  Drug prices often vary depending on where the prescription is filled and some pharmacies may offer cheaper prices.  The website www.goodrx.com contains coupons for medications through different pharmacies. The prices here do not account for what the cost may be with help from insurance (it may be cheaper with your insurance), but the website can give you the price if you did not use any insurance.  - You can print  the associated coupon and take it with your prescription to the pharmacy.  - You may also stop by our office during regular business hours and pick up a GoodRx coupon card.  - If you need your prescription sent electronically to a different pharmacy, notify our office through [facility] or by phone at [phone]

## 2024-03-05 NOTE — Progress Notes (Signed)
   Follow-Up Visit   Subjective  Anna Case is a 53 y.o. female who presents for the following: Discuss pathology results. Patient was seen on 02/27/2024 at which time two biopsies were performed on the right inguinal area and right genitocrural fold. Patient has tried and failed clobetasol  for treatment with a previous provider. Patient has been using the nystatin  power and reports improvement.   The following portions of the chart were reviewed this encounter and updated as appropriate: medications, allergies, medical history  Review of Systems:  No other skin or systemic complaints except as noted in HPI or Assessment and Plan.  Objective  Well appearing patient in no apparent distress; mood and affect are within normal limits.  A focused examination was performed of the following areas: Inguinal area  Relevant exam findings are noted in the Assessment and Plan.    Assessment & Plan   LICHEN SCLEROSUS ET ATROPHICUS Exam: erythematous and white patches +/- erosions at the right inguinal area and the right genitocrural fold  Flared  Lichen sclerosus is a chronic inflammatory condition of unknown cause that frequently involves the vaginal area and less commonly extragenital skin, and is NOT sexually transmitted. It frequently causes symptoms of pain and burning.  It requires regular monitoring and treatment with topical steroids to minimize inflammation and to reduce risk of scarring. There is also a risk of cancer in the vaginal area which is very low if inflammation is well controlled. Regular checks of the area are recommended. Please call if you notice any new or changing spots within this area.  Treatment Plan: Halobetasol  Topical Ointment to the entire area twice a day Monday-Friday for 4 weeks. Then apply Saturday and Sunday for 4 weeks.  Elidel  Ointment to the entire area twice a day Saturday and Sunday for 4 weeks. Then apply Monday-Friday for 4  weeks.   INTERTRIGO Exam: Erythematous macerated patches in body folds  Improved  Intertrigo is a chronic recurrent rash that occurs in skin fold areas that may be associated with friction; heat; moisture; yeast; fungus; and bacteria.  It is exacerbated by increased movement / activity; sweating; and higher atmospheric temperature.  Use of an absorbant powder such as Zeasorb AF powder or other OTC antifungal powder to the area daily can prevent rash recurrence. Other options to help keep the area dry include blow drying the area after bathing or using antiperspirant products such as Duradry sweat minimizing gel.  Treatment Plan: Continue nystatin  powder  Return in about 8 weeks (around 04/30/2024) for Lichen Schlerosus follow up.  Anna Rollene Gobble, RN, am acting as scribe for RUFUS CHRISTELLA HOLY, MD .   Documentation: I have reviewed the above documentation for accuracy and completeness, and I agree with the above.  RUFUS CHRISTELLA HOLY, MD

## 2024-03-13 ENCOUNTER — Other Ambulatory Visit: Payer: Self-pay | Admitting: Student

## 2024-03-13 ENCOUNTER — Ambulatory Visit: Payer: Self-pay | Admitting: Psychiatry

## 2024-03-13 DIAGNOSIS — Z1231 Encounter for screening mammogram for malignant neoplasm of breast: Secondary | ICD-10-CM

## 2024-03-19 ENCOUNTER — Encounter: Payer: Self-pay | Admitting: Psychiatry

## 2024-03-19 ENCOUNTER — Inpatient Hospital Stay: Payer: Medicare (Managed Care) | Attending: Psychiatry | Admitting: Psychiatry

## 2024-03-19 VITALS — BP 137/73 | HR 85 | Temp 98.3°F | Resp 20 | Wt 238.0 lb

## 2024-03-19 DIAGNOSIS — N903 Dysplasia of vulva, unspecified: Secondary | ICD-10-CM

## 2024-03-19 DIAGNOSIS — Z9079 Acquired absence of other genital organ(s): Secondary | ICD-10-CM | POA: Diagnosis not present

## 2024-03-19 DIAGNOSIS — D071 Carcinoma in situ of vulva: Secondary | ICD-10-CM

## 2024-03-19 DIAGNOSIS — Z8542 Personal history of malignant neoplasm of other parts of uterus: Secondary | ICD-10-CM | POA: Insufficient documentation

## 2024-03-19 DIAGNOSIS — C541 Malignant neoplasm of endometrium: Secondary | ICD-10-CM

## 2024-03-19 DIAGNOSIS — C519 Malignant neoplasm of vulva, unspecified: Secondary | ICD-10-CM

## 2024-03-19 DIAGNOSIS — Z90710 Acquired absence of both cervix and uterus: Secondary | ICD-10-CM | POA: Insufficient documentation

## 2024-03-19 DIAGNOSIS — Z90722 Acquired absence of ovaries, bilateral: Secondary | ICD-10-CM | POA: Insufficient documentation

## 2024-03-19 DIAGNOSIS — L280 Lichen simplex chronicus: Secondary | ICD-10-CM

## 2024-03-19 DIAGNOSIS — Z8544 Personal history of malignant neoplasm of other female genital organs: Secondary | ICD-10-CM | POA: Insufficient documentation

## 2024-03-19 NOTE — Progress Notes (Signed)
 Gynecologic Oncology Return Clinic Visit  Date of Service: 03/19/2024 Referring Provider: DELENA Davie Clarity, NP [address] [place],  [place] [address]  Assessment & Plan: Anna Case is a 53 y.o. 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  Vulvar cancer and dysplasia: - NED on exam today - Surveillance for cancer recommended every 3-6 months for the first 2 years, then every 6-43mo for years 3-5.  - Signs/symptoms of recurrence reviewed. - Recommend topical treatment with aquafor, desitin or coconut oil for posterior fourchette tear. - CT on 02/29/24 NED - Follow-up in 3 months.  Endometrial cancer: - NED on last exam. - No high-intermediate risk factors. - Recommend observation. - Signs/symptoms of recurrence reviewed. - Surveillance reviewed. Follow-up dictated by vulvar cancer above.  Hotflashes: - Recommended against hormonal therapy in the setting of endometrial cancer - Pt reports some improvement, has declined medical management  Lichen simplex chronicus with fungal infection: - Pt now established with dermatology, has follow-up scheduled  RTC 3 mo  Hoy Masters, MD Gynecologic Oncology   Medical Decision Making I personally spent  TOTAL 20 minutes face-to-face and non-face-to-face in the care of this patient, which includes all pre, intra, and post visit time on the date of service.   ----------------------- Reason for Visit: Surveillance  Treatment History: Oncology History  Vulvar cancer (HCC)  09/20/2022 Initial Biopsy   FINAL MICROSCOPIC DIAGNOSIS:  A. VULVAR MASS, RIGHT, BIOPSY:  - Invasive well to moderately differentiated squamous cell carcinoma,  see comment  B.  INTROITUS, BIOPSY:  - Low-grade squamous intraepithelial lesion (VIN1, condyloma), see  comment    09/20/2022 Initial Diagnosis   Vulvar cancer   10/13/2022 Imaging   PET: IMPRESSION: Postop changes from right vulvectomy. No evidence of residual or recurrent vulvar mass.   Intense hypermetabolic activity throughout the endometrial cavity of the uterus, highly suspicious for endometrial carcinoma.   Sub-cm bilateral inguinal lymph nodes show mild hypermetabolism. Metastatic disease cannot be excluded.   Sub-centimeter bilateral axillary lymph nodes show mild hypermetabolism, and are indeterminate.   Focal hypermetabolism in lateral right thyroid  lobe. Thyroid  carcinoma cannot be excluded. Recommend thyroid  US  and biopsy. (ref: J Am Coll Radiol. 2015 Feb;12(2): 143-50).   Sub-cm bilateral upper neck lymph nodes show mild hypermetabolism. Metastatic disease cannot be excluded, possibly from thyroid  carcinoma if the hypermetabolic right thyroid  nodule is malignant.   Endometrial cancer (HCC)  10/18/2022 Initial Diagnosis   Endometrial cancer   10/18/2022 Initial Biopsy   FINAL MICROSCOPIC DIAGNOSIS:  A. ENDOMETRIUM, BIOPSY:  - Endometrioid carcinoma, FIGO grade 1  - See comment  COMMENT:  Part A: Immunohistochemical staining for p53 and MMR is pending and will  be reported in an addendum.  Dr. Macie reviewed the case and agrees with  the above diagnosis.  IHC EXPRESSION RESULTS  TEST           RESULT  MLH1:          Preserved nuclear expression  MSH2:          Preserved nuclear expression  MSH6:          Preserved nuclear expression  PMS2:          Preserved nuclear expression  Interval History: Patient reports that she is overall doing well.  Was able to see dermatologist and reports that the cream she was started on has been helping with her itching improving.  Denies any new vulvar lesions or bleeding.  No vaginal bleeding.  Does note that there is a sore spot at the  backside of the opening of the vagina that she noticed after sitting for a long time that feels sore.  Otherwise denies new abdominal/pelvic pain, unintentional weight loss, change in bowel or bladder habits, early satiety, bloating, nausea/vomiting.  Hot flashes are improving in their intensity.   Past Medical/Surgical History: Past Medical History:  Diagnosis Date   Anemia    iron   Cancer (HCC)    vulva   GERD (gastroesophageal reflux disease)    Glaucoma    Hypertension    Pre-diabetes     Past Surgical History:  Procedure Laterality Date   CO2 LASER APPLICATION N/A 01/04/2023   Procedure: CO2 LASER APPLICATION TO VULVA;  Surgeon: Eldonna Mays, MD;  Location: [facility];  Service: Gynecology;  Laterality: N/A;   COLONOSCOPY W/ POLYPECTOMY     LESION DESTRUCTION N/A 11/22/2023   Procedure: CO2 LASER ABLATION OF THE VULVA;  Surgeon: Eldonna Mays, MD;  Location: [facility];  Service: Gynecology;  Laterality: N/A;  CO2 LASER OF VULVAR   ROBOTIC ASSISTED TOTAL HYSTERECTOMY WITH BILATERAL SALPINGO OOPHERECTOMY N/A 01/04/2023   Procedure: XI ROBOTIC ASSISTED TOTAL HYSTERECTOMY WITH BILATERAL SALPINGO OOPHORECTOMY;  Surgeon: Eldonna Mays, MD;  Location: [facility];  Service: Gynecology;  Laterality: N/A;   SENTINEL NODE BIOPSY N/A 01/04/2023   Procedure: SENTINEL NODE BIOPSY;  Surgeon: Eldonna Mays, MD;  Location: [facility];  Service: Gynecology;  Laterality: N/A;   VULVA SURGERY      Family History  Problem Relation Age of Onset   Diabetes Mother    Heart attack Father    Prostate cancer Maternal Uncle 65       metastatic   Colon cancer Maternal Uncle    Prostate cancer Paternal Uncle 33   Breast cancer Other        maternal first cousin once removed   Colon cancer Other        grandmother's sister x2   Prostate cancer Other        grandmother's brother   Prostate cancer Other        grandfather's brother x2   Breast cancer Other        grandfather's sister   Ovarian cancer  Neg Hx    Endometrial cancer Neg Hx    Pancreatic cancer Neg Hx     Social History   Socioeconomic History   Marital status: Single    Spouse name: Not on file   Number of children: Not on file   Years of education: Not on file   Highest education level: Not on file  Occupational History   Not on file  Tobacco Use   Smoking status: Every Day    Current packs/day: 0.25    Average packs/day: 0.3 packs/day for 20.0 years (5.0 ttl pk-yrs)    Types: Cigarettes   Smokeless tobacco: Never  Vaping Use   Vaping status: Never Used  Substance and Sexual Activity   Alcohol  use: No   Drug use: Not Currently    Types: Marijuana   Sexual activity: Not Currently    Partners: Male  Other Topics Concern   Not on file  Social History Narrative   Not on file  Social Drivers of Corporate investment banker Strain: Not on file  Food Insecurity: Food Insecurity Present (01/04/2023)   Hunger Vital Sign    Worried About Running Out of Food in the Last Year: Sometimes true    Ran Out of Food in the Last Year: Sometimes true  Transportation Needs: No Transportation Needs (01/04/2023)   PRAPARE - Administrator, Civil Service (Medical): No    Lack of Transportation (Non-Medical): No  Physical Activity: Not on file  Stress: Not on file  Social Connections: Not on file    Current Medications:  Current Outpatient Medications:    amLODipine -valsartan (EXFORGE) 10-320 MG tablet, Take 1 tablet by mouth daily., Disp: , Rfl:    ascorbic acid (VITAMIN C) 500 MG tablet, Take 500 mg by mouth daily., Disp: , Rfl:    brimonidine  (ALPHAGAN ) 0.2 % ophthalmic solution, Place 1 drop into both eyes in the morning and at bedtime., Disp: , Rfl:    dorzolamide -timolol  (COSOPT ) 2-0.5 % ophthalmic solution, Place 1 drop into both eyes 2 (two) times daily., Disp: , Rfl:    ferrous sulfate 325 (65 FE) MG tablet, Take 325 mg by mouth daily with breakfast., Disp: , Rfl:    glipiZIDE (GLUCOTROL XL) 10 MG  24 hr tablet, Take 10 mg by mouth daily., Disp: , Rfl:    halobetasol  (ULTRAVATE ) 0.05 % cream, Apply topically 2 (two) times daily for 14 days. Apply topically two times daily Monday-Friday for 4 weeks. Then apply Saturday and Sunday for 4 weeks., Disp: 50 g, Rfl: 5   nystatin  (MYCOSTATIN /NYSTOP ) powder, Apply 1 Application topically 2 (two) times daily., Disp: 60 g, Rfl: 5   pimecrolimus  (ELIDEL ) 1 % cream, Apply two times day Saturday and Sunday for 4 weeks. Then apply Monday-Friday for 4 weeks., Disp: 60 g, Rfl: 5   ROCKLATAN  0.02-0.005 % SOLN, Place 1 drop into both eyes at bedtime., Disp: , Rfl:   Review of Symptoms: Complete 10-system review is positive for: Urinary frequency, vision problems, ringing in ears, hot flashes  Physical Exam: BP 137/73 (BP Location: Left Arm, Patient Position: Sitting)   Pulse 85   Temp 98.3 F (36.8 C) (Oral)   Resp 20   Wt 238 lb (108 kg)   LMP 01/04/2023   SpO2 100%   BMI 35.66 kg/m  General: Alert, oriented, no acute distress. HEENT: Normocephalic, atraumatic. Neck symmetric without masses. Sclera anicteric.  Chest: Normal work of breathing. Clear to auscultation bilaterally.   Cardiovascular: Regular rate and rhythm, no murmurs. Abdomen: Soft, nontender.  Normoactive bowel sounds.  No masses appreciated.  Well-healed incisions. Extremities: Grossly normal range of motion.  Warm, well perfused.  No edema bilaterally. Skin: Skin under pannus and bilateral groins with thickening and darkening as prior. Moisture under pannus. Generalized firmness underlying her skin changes. Improvement in these skin changes in bilateral groins. Still with persistent moisture and more severe changes under pannus. GU: External genitalia with evidence of prior posterior radical vulvectomy, well-healed. Subcentimeter superficial laceration of the posterior fourchette.  Speculum exam with normal vaginal mucosa and cuff. Bimanual exam with smooth vaginal cuff, no pelvic mass  or nodularity. Exam chaperoned by Warren Fess, RN   Laboratory & Radiologic Studies:  CT ABDOMEN PELVIS W CONTRAST 02/29/2024  Narrative CLINICAL DATA:  Vulvar cancer, endometrial cancer * Tracking Code: BO *  EXAM: CT ABDOMEN AND PELVIS WITH CONTRAST  TECHNIQUE: Multidetector CT imaging of the abdomen and pelvis was performed using the standard protocol following  bolus administration of intravenous contrast.  RADIATION DOSE REDUCTION: This exam was performed according to the departmental dose-optimization program which includes automated exposure control, adjustment of the mA and/or kV according to patient size and/or use of iterative reconstruction technique.  CONTRAST:  100mL ISOVUE -300 IOPAMIDOL  (ISOVUE -300) INJECTION 61%  COMPARISON:  PET-CT, 10/13/2022  FINDINGS: Lower chest: No acute abnormality.  Hepatobiliary: No solid liver abnormality is seen. Hepatomegaly, maximum coronal span 21.9 cm. Hepatic steatosis. No gallstones, gallbladder wall thickening, or biliary dilatation.  Pancreas: Probable large intrapancreatic splenule in the tip of the pancreatic tail, unchanged compared to prior examination and without abnormal FDG avidity (series 2, image 22). No pancreatic ductal dilatation or surrounding inflammatory changes.  Spleen: Normal in size without significant abnormality.  Adrenals/Urinary Tract: Adrenal glands are unremarkable. Kidneys are normal, without renal calculi, solid lesion, or hydronephrosis. Bladder is unremarkable.  Stomach/Bowel: Stomach is within normal limits. Appendix appears normal. No evidence of bowel wall thickening, distention, or inflammatory changes.  Vascular/Lymphatic: Aortic atherosclerosis. No enlarged abdominal or pelvic lymph nodes.  Reproductive: Hysterectomy.  Other: No abdominal wall hernia or abnormality. No ascites.  Musculoskeletal: No acute or significant osseous findings.  IMPRESSION: 1. Hysterectomy. 2. No  evidence of lymphadenopathy or metastatic disease in the abdomen or pelvis. 3. Hepatomegaly and hepatic steatosis.  Aortic Atherosclerosis (ICD10-I70.0).   Electronically Signed By: Marolyn JONETTA Jaksch M.D. On: 03/12/2024 21:45

## 2024-03-19 NOTE — Patient Instructions (Signed)
 It was a pleasure to see you in clinic today. - Normal exam - You can try aquafor, coconut oil or desitin cream for the opening to the vagina - Return visit planned for 56mo  Thank you very much for allowing me to provide care for you today.  I appreciate your confidence in choosing our Gynecologic Oncology team at [facility].  If you have any questions about your visit today please call our office or send us  a MyChart message and we will get back to you as soon as possible.

## 2024-03-22 DIAGNOSIS — E119 Type 2 diabetes mellitus without complications: Secondary | ICD-10-CM | POA: Diagnosis not present

## 2024-03-22 DIAGNOSIS — I10 Essential (primary) hypertension: Secondary | ICD-10-CM | POA: Diagnosis not present

## 2024-03-22 DIAGNOSIS — Z299 Encounter for prophylactic measures, unspecified: Secondary | ICD-10-CM | POA: Diagnosis not present

## 2024-03-22 DIAGNOSIS — Z6837 Body mass index (BMI) 37.0-37.9, adult: Secondary | ICD-10-CM | POA: Diagnosis not present

## 2024-04-02 ENCOUNTER — Encounter: Payer: Self-pay | Admitting: Dermatology

## 2024-04-02 ENCOUNTER — Ambulatory Visit (INDEPENDENT_AMBULATORY_CARE_PROVIDER_SITE_OTHER): Payer: Medicare (Managed Care) | Admitting: Dermatology

## 2024-04-02 VITALS — BP 145/90 | HR 78

## 2024-04-02 DIAGNOSIS — L304 Erythema intertrigo: Secondary | ICD-10-CM | POA: Diagnosis not present

## 2024-04-02 DIAGNOSIS — L900 Lichen sclerosus et atrophicus: Secondary | ICD-10-CM | POA: Diagnosis not present

## 2024-04-02 NOTE — Progress Notes (Signed)
   Follow-Up Visit   Subjective  Anna Case is a 53 y.o. female who presents for the following: follow up for treatment of lichen sclerosus. She has been using halobetasol  BID during the weekdays and TCI BID during the weekends. She is 3 weeks into the regimen. She states that the itching is improving. She is also using the nystatin  powder for the intertrigo.   The following portions of the chart were reviewed this encounter and updated as appropriate: medications, allergies, medical history  Review of Systems:  No other skin or systemic complaints except as noted in HPI or Assessment and Plan.  Objective  Well appearing patient in no apparent distress; mood and affect are within normal limits.  A full examination was performed including scalp, head, eyes, ears, nose, lips, neck, chest, axillae, abdomen, back, buttocks, bilateral upper extremities, bilateral lower extremities, hands, feet, fingers, toes, fingernails, and toenails. All findings within normal limits unless otherwise noted below.    Relevant exam findings are noted in the Assessment and Plan.    Assessment & Plan   LICHEN SCLEROSUS ET ATROPHICUS Exam: erythematous and white patches +/- erosions at the right inguinal area and the right genitocrural fold   Flared   Lichen sclerosus is a chronic inflammatory condition of unknown cause that frequently involves the vaginal area and less commonly extragenital skin, and is NOT sexually transmitted. It frequently causes symptoms of pain and burning.  It requires regular monitoring and treatment with topical steroids to minimize inflammation and to reduce risk of scarring. There is also a risk of cancer in the vaginal area which is very low if inflammation is well controlled. Regular checks of the area are recommended. Please call if you notice any new or changing spots within this area.   Treatment Plan: Halobetasol  Topical Ointment to the entire area twice a day  Monday-Friday for 4 weeks. Then apply Saturday and Sunday for 4 weeks.  Elidel  Ointment to the entire area twice a day Saturday and Sunday for 4 weeks. Then apply Monday-Friday for 4 weeks. Will follow up further into her treatment, at least 2 months after starting the treatments   INTERTRIGO Exam: Erythematous macerated patches in body folds   Improved   Intertrigo is a chronic recurrent rash that occurs in skin fold areas that may be associated with friction; heat; moisture; yeast; fungus; and bacteria.  It is exacerbated by increased movement / activity; sweating; and higher atmospheric temperature.  Use of an absorbant powder such as Zeasorb AF powder or other OTC antifungal powder to the area daily can prevent rash recurrence. Other options to help keep the area dry include blow drying the area after bathing or using antiperspirant products such as Duradry sweat minimizing gel.   Treatment Plan: Continue nystatin  powder   Return in about 6 weeks (around 05/14/2024) for return for f/u lichen sclerosus .  I, Berwyn Lesches, Surg Tech III, am acting as scribe for RUFUS CHRISTELLA HOLY, MD.   Documentation: I have reviewed the above documentation for accuracy and completeness, and I agree with the above.  RUFUS CHRISTELLA HOLY, MD

## 2024-04-02 NOTE — Patient Instructions (Signed)

## 2024-04-17 ENCOUNTER — Ambulatory Visit
Admission: RE | Admit: 2024-04-17 | Discharge: 2024-04-17 | Disposition: A | Payer: Medicare (Managed Care) | Source: Ambulatory Visit | Attending: Student

## 2024-04-17 DIAGNOSIS — Z1231 Encounter for screening mammogram for malignant neoplasm of breast: Secondary | ICD-10-CM

## 2024-05-10 DIAGNOSIS — F17210 Nicotine dependence, cigarettes, uncomplicated: Secondary | ICD-10-CM | POA: Diagnosis not present

## 2024-05-10 DIAGNOSIS — Z299 Encounter for prophylactic measures, unspecified: Secondary | ICD-10-CM | POA: Diagnosis not present

## 2024-05-10 DIAGNOSIS — E7800 Pure hypercholesterolemia, unspecified: Secondary | ICD-10-CM | POA: Diagnosis not present

## 2024-05-10 DIAGNOSIS — E669 Obesity, unspecified: Secondary | ICD-10-CM | POA: Diagnosis not present

## 2024-05-10 DIAGNOSIS — Z1331 Encounter for screening for depression: Secondary | ICD-10-CM | POA: Diagnosis not present

## 2024-05-10 DIAGNOSIS — R5383 Other fatigue: Secondary | ICD-10-CM | POA: Diagnosis not present

## 2024-05-10 DIAGNOSIS — Z79899 Other long term (current) drug therapy: Secondary | ICD-10-CM | POA: Diagnosis not present

## 2024-05-10 DIAGNOSIS — Z1339 Encounter for screening examination for other mental health and behavioral disorders: Secondary | ICD-10-CM | POA: Diagnosis not present

## 2024-05-10 DIAGNOSIS — Z6838 Body mass index (BMI) 38.0-38.9, adult: Secondary | ICD-10-CM | POA: Diagnosis not present

## 2024-05-10 DIAGNOSIS — Z0000 Encounter for general adult medical examination without abnormal findings: Secondary | ICD-10-CM | POA: Diagnosis not present

## 2024-05-10 DIAGNOSIS — Z7189 Other specified counseling: Secondary | ICD-10-CM | POA: Diagnosis not present

## 2024-05-10 DIAGNOSIS — I10 Essential (primary) hypertension: Secondary | ICD-10-CM | POA: Diagnosis not present

## 2024-05-14 ENCOUNTER — Encounter: Payer: Self-pay | Admitting: Dermatology

## 2024-05-14 ENCOUNTER — Ambulatory Visit (INDEPENDENT_AMBULATORY_CARE_PROVIDER_SITE_OTHER): Payer: Medicare (Managed Care) | Admitting: Dermatology

## 2024-05-14 VITALS — BP 168/88 | HR 92 | Temp 97.9°F

## 2024-05-14 DIAGNOSIS — Z7689 Persons encountering health services in other specified circumstances: Secondary | ICD-10-CM | POA: Diagnosis not present

## 2024-05-14 DIAGNOSIS — L304 Erythema intertrigo: Secondary | ICD-10-CM | POA: Diagnosis not present

## 2024-05-14 DIAGNOSIS — L900 Lichen sclerosus et atrophicus: Secondary | ICD-10-CM

## 2024-05-14 NOTE — Progress Notes (Signed)
   Follow-Up Visit   Subjective  Anna Case is a 53 y.o. female who presents for the following: Lichen sclerosus  Patient present today for follow up visit for Lichen sclerosus. Patient was last evaluated on 04/02/24. At this visit patient was advised to continue nystatin  powder. Patient reports sxs are better. Patient denies medication changes.  Patient states she has been using the halobetasol  and is seeing good results with that and used the last of her Elidel  last week.  The following portions of the chart were reviewed this encounter and updated as appropriate: medications, allergies, medical history  Review of Systems:  No other skin or systemic complaints except as noted in HPI or Assessment and Plan.  Objective  Well appearing patient in no apparent distress; mood and affect are within normal limits.  A focused examination was performed of the following areas: lower abdomen, groin  Relevant exam findings are noted in the Assessment and Plan.    Assessment & Plan   LICHEN SCLEROSUS ET ATROPHICUS Exam: erythematous and white patches +/- erosions at the right inguinal area and the right genitocrural fold   Improved   Lichen sclerosus is a chronic inflammatory condition of unknown cause that frequently involves the vaginal area and less commonly extragenital skin, and is NOT sexually transmitted. It frequently causes symptoms of pain and burning.  It requires regular monitoring and treatment with topical steroids to minimize inflammation and to reduce risk of scarring. There is also a risk of cancer in the vaginal area which is very low if inflammation is well controlled. Regular checks of the area are recommended. Please call if you notice any new or changing spots within this area.   Treatment Plan: Continue to use halobetasol  ointment PRN for flares and elidel  for maintenance   INTERTRIGO Exam: Erythematous macerated patches in body folds   Improved   Intertrigo  is a chronic recurrent rash that occurs in skin fold areas that may be associated with friction; heat; moisture; yeast; fungus; and bacteria.  It is exacerbated by increased movement / activity; sweating; and higher atmospheric temperature.  Use of an absorbant powder such as Zeasorb AF powder or other OTC antifungal powder to the area daily can prevent rash recurrence. Other options to help keep the area dry include blow drying the area after bathing or using antiperspirant products such as Duradry sweat minimizing gel.   Treatment Plan: Continue nystatin  powder    Return in about 6 months (around 11/11/2024) for Lichen sclerosus follow up.  I, Doyce Pan, CMA, am acting as scribe for RUFUS CHRISTELLA HOLY, MD.   Documentation: I have reviewed the above documentation for accuracy and completeness, and I agree with the above.  RUFUS CHRISTELLA HOLY, MD

## 2024-05-14 NOTE — Patient Instructions (Signed)

## 2024-06-18 ENCOUNTER — Inpatient Hospital Stay: Payer: Medicare (Managed Care) | Attending: Psychiatry | Admitting: Psychiatry

## 2024-06-18 ENCOUNTER — Telehealth: Payer: Self-pay | Admitting: *Deleted

## 2024-06-18 DIAGNOSIS — C519 Malignant neoplasm of vulva, unspecified: Secondary | ICD-10-CM

## 2024-06-18 NOTE — Telephone Encounter (Signed)
 Patient called and moved her appt from 12/8 to 1/26. Patient aware

## 2024-06-19 NOTE — Progress Notes (Signed)
 This encounter was created in error - please disregard.

## 2024-06-20 DIAGNOSIS — H401133 Primary open-angle glaucoma, bilateral, severe stage: Secondary | ICD-10-CM | POA: Diagnosis not present

## 2024-06-26 DIAGNOSIS — Z299 Encounter for prophylactic measures, unspecified: Secondary | ICD-10-CM | POA: Diagnosis not present

## 2024-06-26 DIAGNOSIS — F17210 Nicotine dependence, cigarettes, uncomplicated: Secondary | ICD-10-CM | POA: Diagnosis not present

## 2024-06-26 DIAGNOSIS — E7800 Pure hypercholesterolemia, unspecified: Secondary | ICD-10-CM | POA: Diagnosis not present

## 2024-06-26 DIAGNOSIS — I10 Essential (primary) hypertension: Secondary | ICD-10-CM | POA: Diagnosis not present

## 2024-06-26 DIAGNOSIS — Z6839 Body mass index (BMI) 39.0-39.9, adult: Secondary | ICD-10-CM | POA: Diagnosis not present

## 2024-06-26 DIAGNOSIS — E119 Type 2 diabetes mellitus without complications: Secondary | ICD-10-CM | POA: Diagnosis not present

## 2024-06-26 DIAGNOSIS — E2839 Other primary ovarian failure: Secondary | ICD-10-CM | POA: Diagnosis not present

## 2024-08-06 ENCOUNTER — Inpatient Hospital Stay: Payer: Medicare (Managed Care) | Admitting: Psychiatry

## 2024-08-06 DIAGNOSIS — C519 Malignant neoplasm of vulva, unspecified: Secondary | ICD-10-CM

## 2024-08-06 DIAGNOSIS — C541 Malignant neoplasm of endometrium: Secondary | ICD-10-CM

## 2024-08-07 ENCOUNTER — Telehealth: Payer: Self-pay | Admitting: *Deleted

## 2024-08-07 ENCOUNTER — Inpatient Hospital Stay: Payer: Medicare (Managed Care) | Admitting: Psychiatry

## 2024-08-07 NOTE — Telephone Encounter (Signed)
 Spoke with the patient and moved appt from 3/3 to 2/3

## 2024-08-07 NOTE — Progress Notes (Signed)
 This encounter was created in error - please disregard.

## 2024-08-14 ENCOUNTER — Inpatient Hospital Stay: Payer: Medicare (Managed Care) | Attending: Psychiatry | Admitting: Psychiatry

## 2024-08-14 ENCOUNTER — Encounter: Payer: Self-pay | Admitting: Psychiatry

## 2024-08-14 VITALS — BP 127/78 | HR 95 | Temp 98.4°F | Resp 18 | Ht 68.5 in | Wt 245.0 lb

## 2024-08-14 DIAGNOSIS — Z8544 Personal history of malignant neoplasm of other female genital organs: Secondary | ICD-10-CM | POA: Diagnosis not present

## 2024-08-14 DIAGNOSIS — Z90710 Acquired absence of both cervix and uterus: Secondary | ICD-10-CM | POA: Insufficient documentation

## 2024-08-14 DIAGNOSIS — Z8542 Personal history of malignant neoplasm of other parts of uterus: Secondary | ICD-10-CM | POA: Diagnosis not present

## 2024-08-14 DIAGNOSIS — Z9079 Acquired absence of other genital organ(s): Secondary | ICD-10-CM | POA: Insufficient documentation

## 2024-08-14 DIAGNOSIS — C541 Malignant neoplasm of endometrium: Secondary | ICD-10-CM

## 2024-08-14 DIAGNOSIS — L280 Lichen simplex chronicus: Secondary | ICD-10-CM | POA: Insufficient documentation

## 2024-08-14 DIAGNOSIS — C519 Malignant neoplasm of vulva, unspecified: Secondary | ICD-10-CM

## 2024-08-14 DIAGNOSIS — Z90722 Acquired absence of ovaries, bilateral: Secondary | ICD-10-CM | POA: Insufficient documentation

## 2024-08-27 ENCOUNTER — Other Ambulatory Visit: Payer: Medicare (Managed Care)

## 2024-10-08 ENCOUNTER — Inpatient Hospital Stay: Payer: Medicare (Managed Care) | Admitting: Psychiatry

## 2024-11-12 ENCOUNTER — Ambulatory Visit: Payer: Medicare (Managed Care) | Admitting: Dermatology

## 2024-11-19 ENCOUNTER — Inpatient Hospital Stay: Payer: Medicare (Managed Care) | Admitting: Psychiatry
